# Patient Record
Sex: Male | Born: 1980 | Race: White | Hispanic: No | Marital: Married | Smoking: Current every day smoker
Health system: Southern US, Community
[De-identification: ages and names within clinical notes are randomized; demographics above are authoritative.]

## PROBLEM LIST (undated history)

## (undated) DIAGNOSIS — F329 Major depressive disorder, single episode, unspecified: Secondary | ICD-10-CM

## (undated) DIAGNOSIS — K589 Irritable bowel syndrome without diarrhea: Secondary | ICD-10-CM

## (undated) DIAGNOSIS — F32A Depression, unspecified: Secondary | ICD-10-CM

## (undated) DIAGNOSIS — Z8781 Personal history of (healed) traumatic fracture: Secondary | ICD-10-CM

## (undated) DIAGNOSIS — Z77011 Contact with and (suspected) exposure to lead: Secondary | ICD-10-CM

## (undated) DIAGNOSIS — K219 Gastro-esophageal reflux disease without esophagitis: Secondary | ICD-10-CM

## (undated) DIAGNOSIS — F419 Anxiety disorder, unspecified: Secondary | ICD-10-CM

## (undated) DIAGNOSIS — F17200 Nicotine dependence, unspecified, uncomplicated: Secondary | ICD-10-CM

## (undated) DIAGNOSIS — Z5320 Procedure and treatment not carried out because of patient's decision for unspecified reasons: Secondary | ICD-10-CM

## (undated) DIAGNOSIS — E7800 Pure hypercholesterolemia, unspecified: Secondary | ICD-10-CM

## (undated) HISTORY — DX: Personal history of (healed) traumatic fracture: Z87.81

## (undated) HISTORY — DX: Irritable bowel syndrome, unspecified: K58.9

## (undated) HISTORY — DX: Depression, unspecified: F32.A

## (undated) HISTORY — DX: Pure hypercholesterolemia, unspecified: E78.00

## (undated) HISTORY — DX: Gastro-esophageal reflux disease without esophagitis: K21.9

## (undated) HISTORY — DX: Anxiety disorder, unspecified: F41.9

## (undated) HISTORY — DX: Nicotine dependence, unspecified, uncomplicated: F17.200

## (undated) HISTORY — DX: Contact with and (suspected) exposure to lead: Z77.011

## (undated) HISTORY — DX: Major depressive disorder, single episode, unspecified: F32.9

---
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
## 2018-06-13 HISTORY — PX: AMPUTATION: SHX166

## 2018-06-25 ENCOUNTER — Ambulatory Visit: Payer: BLUE CROSS/BLUE SHIELD | Admitting: Family Medicine

## 2018-06-25 ENCOUNTER — Encounter: Payer: Self-pay | Admitting: Family Medicine

## 2018-06-25 VITALS — BP 120/70 | HR 87 | Temp 98.7°F | Ht 75.0 in | Wt 200.4 lb

## 2018-06-25 DIAGNOSIS — F419 Anxiety disorder, unspecified: Secondary | ICD-10-CM

## 2018-06-25 DIAGNOSIS — Z8781 Personal history of (healed) traumatic fracture: Secondary | ICD-10-CM | POA: Insufficient documentation

## 2018-06-25 DIAGNOSIS — F32A Depression, unspecified: Secondary | ICD-10-CM | POA: Insufficient documentation

## 2018-06-25 DIAGNOSIS — F329 Major depressive disorder, single episode, unspecified: Secondary | ICD-10-CM

## 2018-06-25 DIAGNOSIS — E785 Hyperlipidemia, unspecified: Secondary | ICD-10-CM | POA: Insufficient documentation

## 2018-06-25 DIAGNOSIS — E7800 Pure hypercholesterolemia, unspecified: Secondary | ICD-10-CM | POA: Insufficient documentation

## 2018-06-25 DIAGNOSIS — F17200 Nicotine dependence, unspecified, uncomplicated: Secondary | ICD-10-CM | POA: Insufficient documentation

## 2018-06-25 DIAGNOSIS — Z0000 Encounter for general adult medical examination without abnormal findings: Secondary | ICD-10-CM | POA: Diagnosis not present

## 2018-06-25 DIAGNOSIS — K439 Ventral hernia without obstruction or gangrene: Secondary | ICD-10-CM | POA: Insufficient documentation

## 2018-06-25 DIAGNOSIS — Z77011 Contact with and (suspected) exposure to lead: Secondary | ICD-10-CM | POA: Insufficient documentation

## 2018-06-25 DIAGNOSIS — Z23 Encounter for immunization: Secondary | ICD-10-CM

## 2018-06-25 LAB — POCT URINALYSIS DIP (PROADVANTAGE DEVICE)
Bilirubin, UA: NEGATIVE
Blood, UA: NEGATIVE
Glucose, UA: NEGATIVE mg/dL
Ketones, POC UA: NEGATIVE mg/dL
LEUKOCYTES UA: NEGATIVE
NITRITE UA: NEGATIVE
Protein Ur, POC: NEGATIVE mg/dL
Specific Gravity, Urine: 1.025
UUROB: NEGATIVE
pH, UA: 6 (ref 5.0–8.0)

## 2018-06-25 MED ORDER — ESCITALOPRAM OXALATE 20 MG PO TABS: 20.0000 mg | ORAL_TABLET | Freq: Every day | ORAL | 1 refills | Status: DC

## 2018-06-25 NOTE — Progress Notes (Signed)
Subjective:    Patient ID: Francisco Taylor, male    DOB: 05-05-1981, 38 y.o.   MRN: [id number]  HPI Chief Complaint  Patient presents with  . New Patient (Initial Visit)  . Annual Exam   He is new to the practice and here for a complete physical exam. Previous medical care: [place], [place]  PCP in [place]: one year ago   Other providers: None   Past medical history: Hyperlipidemia- in the past per patient but has never taken medication for this.  States he most recently had normal lipid panel.  States he improved his diet and increased exercise to get this back to normal.  Reports having musculoskeletal pain related to surfing a lot when he was young when he lived in [place].   Mental health history: Anxiety and depression- has been stable on Lexapro for the past 5 years  Started on treatment at age 39.  Has tried different medications over the years. Counseling- not currently but he has in the past.  Reports having a history of a hernia above his umbilicus.  States it occasionally causes some discomfort.  His work sometimes involves lifting heavy items.  Unsure if it is getting larger.  He would like to have this evaluated and potentially be referred to a surgeon.   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  States his employer usually test him for lead exposure.  Reports having a skin issue on his testicles that he would like to have examined.  Reports doing self testicular exams and no lumps or masses.  Social history: Lives with his wife, no kids, works as a Games developer. Smoking 2 ppd and has a 20-year smoking history.  He has tried to stop a couple of times but is not currently planning on stopping. Reports drinking approximately 7 alcoholic drinks per week.  Denies drug use.  Diet: fairly healthy  Exercise: several days per week.   Immunizations: Tdap last year per patient. Flu shot today.  Not aware of having HPV  vaccine  Family history: depression in parents, diabetes- grandfather   Health maintenance:  Colonoscopy: N/A Last PSA: N/A Last Dental Exam: 6-7 years ago. Reports having a fear of dentists.  Last Eye Exam: 2-3 years ago   Wears seatbelt always, uses sunscreen, smoke detectors in home and functioning, does not text while driving, feels safe in home environment.  Reviewed allergies, medications, past medical, surgical, family, and social history.   Review of Systems Review of Systems Constitutional: -fever, -chills, -sweats, -unexpected weight change,-fatigue ENT: -runny nose, -ear pain, -sore throat Cardiology:  -chest pain, -palpitations, -edema Respiratory: -cough, -shortness of breath, -wheezing Gastroenterology: -abdominal pain, -nausea, -vomiting, -diarrhea, -constipation  Hematology: -bleeding or bruising problems Musculoskeletal: -arthralgias, -myalgias, -joint swelling, -back pain Ophthalmology: -vision changes Urology: -dysuria, -difficulty urinating, -hematuria, -urinary frequency, -urgency Neurology: -headache, -weakness, -tingling, -numbness       Objective:   Physical Exam BP 120/70   Pulse 87   Temp 98.7 F (37.1 C) (Oral)   Ht 6\' 3"  (1.905 m)   Wt 200 lb 6.4 oz (90.9 kg)   SpO2 97%   BMI 25.05 kg/m   General Appearance:    Alert, cooperative, no distress, appears stated age  Head:    Normocephalic, without obvious abnormality, atraumatic  Eyes:    PERRL, conjunctiva/corneas clear, EOM's intact, fundi    benign  Ears:    Normal TM's and external ear canals  Nose:  Nares normal, mucosa normal, no drainage or sinus   tenderness  Throat:   Lips, mucosa, and tongue normal; teeth and gums normal  Neck:   Supple, no lymphadenopathy;  thyroid:  no   enlargement/tenderness/nodules; no carotid   bruit or JVD  Back:    Spine nontender, no curvature, ROM normal, no CVA     tenderness  Lungs:     Clear to auscultation bilaterally without wheezes, rales or      ronchi; respirations unlabored  Chest Wall:    No tenderness or deformity   Heart:    Regular rate and rhythm, S1 and S2 normal, no murmur, rub   or gallop  Breast Exam:    No chest wall tenderness, masses or gynecomastia  Abdomen:     Soft, non-tender, nondistended, normoactive bowel sounds,    no masses, no hepatosplenomegaly  Genitalia:    Normal male external genitalia with 2 small superficial benign appearing cysts, nontender. testicles without masses.  No inguinal hernias.  Rectal:   Deferred due to age <40 and lack of symptoms  Extremities:   No clubbing, cyanosis or edema  Pulses:   2+ and symmetric all extremities  Skin:   Skin color, texture, turgor normal, no rashes or lesions  Lymph nodes:   Cervical, supraclavicular, and axillary nodes normal  Neurologic:   CNII-XII intact, normal strength, sensation and gait; reflexes 2+ and symmetric throughout          Psych:   Normal mood, affect, hygiene and grooming.    Urinalysis dipstick: negative       Assessment & Plan:  Routine general medical examination at a health care facility - Plan: POCT Urinalysis DIP (Proadvantage Device)  Needs flu shot - Plan: Flu Vaccine QUAD 36+ mos IM  Anxiety and depression - Plan: escitalopram (LEXAPRO) 20 MG tablet  Smoker  Abdominal wall hernia - Plan: Ambulatory referral to General Surgery  Is a pleasant 38 year old male who is new to the practice and here to establish care as well as for a CPE.  He is not fasting and does not want blood work checked today. Reports being stable on his current dose of Lexapro for several years.  Requests a refill.  He is not involved in counseling but is willing to consider scheduling appointment.  I will give him a list of offices that he can call to schedule with if he chooses.  Smoking cessation counseling was done.  He has a heavy smoking history.  He is not ready to stop. He does have a small abdominal wall hernia and he would like a referral to general  surgery.  This was done per patient request.  Counseling done on the possible complication of incarceration or strangulation and when to seek immediate medical care. Dorothea Ogle PA also examined patient's testicles at my request and he appears to have 2 small benign appearing superficial skin cysts that do not require further investigation.  The patient will continue doing self testicular exams. Immunization counseling done.  Flu shot was given.  He reports being up-to-date on Tdap, no records available.  Discussed HPV vaccine and he may check with his insurance company regarding this immunization. Recommend regular dental cleanings.  He has a fear of dental appointments.  Discussed the possibility of prescribing antianxiety medication specifically for this if he decides to schedule an appointment. 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  He declines blood work today and states his employer  does not periodic lead screening. Follow-up in 6 months or sooner if needed.

## 2018-06-25 NOTE — Patient Instructions (Addendum)
It was a pleasure meeting you today.  Continue eating a well-balanced, healthy diet and getting at least 150 minutes of physical activity per week.  You received your flu shot today.  You may check with your insurance regarding the HPV vaccine. This is a 3 shot series over 6 months.  Call and schedule a nurse visit if you decide to get these.   I recommend that you strongly consider stopping smoking.  You can check with your insurance carrier regarding dentists who are in your network.  You can call to schedule your appointment with a counselor. A couple offices are listed below for you to call.   [facility]  [address], [place], [place] [address] Phone: [phone]   [place]  [place]  (across from [facility])  [facility] [address] [place], [place] [address] [address], [place], [place] [address]  Phone: [phone]   [place], [place] [address]  Phone: [phone]    Preventive Care 18-39 Years, Male Preventive care refers to lifestyle choices and visits with your health care provider that can promote health and wellness. What does preventive care include?   A yearly physical exam. This is also called an annual well check.  Dental exams once or twice a year.  Routine eye exams. Ask your health care provider how often you should have your eyes checked.  Personal lifestyle choices, including: ? Daily care of your teeth and gums. ? Regular physical activity. ? Eating a healthy diet. ? Avoiding tobacco and drug use. ? Limiting alcohol use. ? Practicing safe sex. What happens during an annual well check? The services and screenings done by your health care provider during your annual well check will  depend on your age, overall health, lifestyle risk factors, and family history of disease. Counseling Your health care provider may ask you questions about your:  Alcohol use.  Tobacco use.  Drug use.  Emotional well-being.  Home and relationship well-being.  Sexual activity.  Eating habits.  Work and work Statistician. Screening You may have the following tests or measurements:  Height, weight, and BMI.  Blood pressure.  Lipid and cholesterol levels. These may be checked every 5 years starting at age 7.  Diabetes screening. This is done by checking your blood sugar (glucose) after you have not eaten for a while (fasting).  Skin check.  Hepatitis C blood test.  Hepatitis B blood test.  Sexually transmitted disease (STD) testing. Discuss your test results, treatment options, and if necessary, the need for more tests with your health care provider. Vaccines Your health care provider may recommend certain vaccines, such as:  Influenza vaccine. This is recommended every year.  Tetanus, diphtheria, and acellular pertussis (Tdap, Td) vaccine. You may need a Td booster every 10 years.  Varicella vaccine. You may need this if you have not been vaccinated.  HPV vaccine. If you are 5 or younger, you may need three doses over 6 months.  Measles, mumps, and rubella (MMR) vaccine. You may need at least one dose of MMR.You may also need a second dose.  Pneumococcal 13-valent conjugate (PCV13) vaccine. You may need this if you have certain conditions and have not been vaccinated.  Pneumococcal polysaccharide (PPSV23) vaccine. You may need  one or two doses if you smoke cigarettes or if you have certain conditions.  Meningococcal vaccine. One dose is recommended if you are age 24-21 years and a first-year college student living in a residence hall, or if you have one of several medical conditions. You may also need additional booster doses.  Hepatitis A vaccine. You may need  this if you have certain conditions or if you travel or work in places where you may be exposed to hepatitis A.  Hepatitis B vaccine. You may need this if you have certain conditions or if you travel or work in places where you may be exposed to hepatitis B.  Haemophilus influenzae type b (Hib) vaccine. You may need this if you have certain risk factors. Talk to your health care provider about which screenings and vaccines you need and how often you need them. This information is not intended to replace advice given to you by your health care provider. Make sure you discuss any questions you have with your health care provider. Document Released: 07/26/2001 Document Revised: 01/10/2017 Document Reviewed: 03/31/2015 Elsevier Interactive Patient Education  2019 [employer].

## 2018-08-15 ENCOUNTER — Ambulatory Visit (HOSPITAL_COMMUNITY)
Admission: EM | Admit: 2018-08-15 | Discharge: 2018-08-15 | Disposition: A | Discharge status: Home / Self Care | Payer: PRIVATE HEALTH INSURANCE | Attending: Family Medicine | Admitting: Family Medicine

## 2018-08-15 ENCOUNTER — Encounter (HOSPITAL_COMMUNITY): Payer: Self-pay | Admitting: Emergency Medicine

## 2018-08-15 DIAGNOSIS — S61412A Laceration without foreign body of left hand, initial encounter: Secondary | ICD-10-CM

## 2018-08-15 NOTE — ED Triage Notes (Signed)
Pt states he stuck a chisel in his L hand. Wound evaluated by another clinical staff. Currently wrapped up. Up to date on tetanus.

## 2018-08-27 NOTE — ED Provider Notes (Signed)
  Francisco Taylor   [id number] 08/15/18 Arrival Time: 1916  ASSESSMENT & PLAN:  1. Laceration of left hand without foreign body, initial encounter    Procedure: Verbal consent obtained. Patient provided with risks and alternatives to the procedure. Wound copiously irrigated with NS then cleansed with betadine. Anesthetized with 3 mL of lidocaine without epinephrine. Wound carefully explored. No foreign body, tendon injury, or nonviable tissue were noted. Using sterile technique, wound closed with interrupted 5-0 Prolene sutures were placed to reapproximate the wound. Patient tolerated procedure well. No complications. Minimal bleeding. Patient advised to look for and return for any signs of infection such as redness, swelling, discharge, or worsening pain. Return for suture removal in 7 days.  Reviewed expectations re: course of current medical issues. Questions answered. Outlined signs and symptoms indicating need for more acute intervention. Patient verbalized understanding. After Visit Summary given.   SUBJECTIVE:  Francisco Taylor is a 38 y.o. male who presents with a laceration of his left hand. Cut with a chisel at work. Moderate bleeding; controlled with pressure. No extremity sensation changes or weakness. Describes FROM of all fingers. No extremity sensation changes or weakness.  Td UTD: Yes.  ROS: As per HPI.   OBJECTIVE:  Vitals:   08/15/18 2014  BP: 122/79  Pulse: 100  Resp: 16  Temp: 98.5 F (36.9 C)  SpO2: 100%     General appearance: alert; no distress Skin: curved laceration L hand; palmar; near proximal thumb; size: approx 1.5 cm; clean wound edges, no foreign bodies; without active bleeding Psychological: alert and cooperative; normal mood and affect    Allergies  Allergen Reactions  . Bee Venom Swelling    Past Medical History:  Diagnosis Date  . Anxiety and depression   . History of cervical fracture   . Hx of lead exposure    Social  History   Socioeconomic History  . Marital status: Married    Spouse name: Not on file  . Number of children: Not on file  . Years of education: Not on file  . Highest education level: Not on file  Occupational History  . Not on file  Social Needs  . Financial resource strain: Not on file  . Food insecurity:    Worry: Not on file    Inability: Not on file  . Transportation needs:    Medical: Not on file    Non-medical: Not on file  Tobacco Use  . Smoking status: Current Every Day Smoker    Years: 20.00  . Smokeless tobacco: Never Used  Substance and Sexual Activity  . Alcohol use: Yes    Comment: 7-10 drinks per week   . Drug use: Never  . Sexual activity: Yes  Lifestyle  . Physical activity:    Days per week: Not on file    Minutes per session: Not on file  . Stress: Not on file  Relationships  . Social connections:    Talks on phone: Not on file    Gets together: Not on file    Attends religious service: Not on file    Active member of club or organization: Not on file    Attends meetings of clubs or organizations: Not on file    Relationship status: Not on file  Other Topics Concern  . Not on file  Social History Narrative  . Not on file         Vanessa Kick, MD 08/30/18 [phone]

## 2018-09-26 ENCOUNTER — Emergency Department (HOSPITAL_COMMUNITY)
Admission: EM | Admit: 2018-09-26 | Discharge: 2018-09-26 | Disposition: A | Discharge status: Home / Self Care | Payer: PRIVATE HEALTH INSURANCE | Attending: Emergency Medicine | Admitting: Emergency Medicine

## 2018-09-26 ENCOUNTER — Encounter (HOSPITAL_COMMUNITY): Payer: Self-pay

## 2018-09-26 ENCOUNTER — Other Ambulatory Visit: Payer: Self-pay

## 2018-09-26 ENCOUNTER — Emergency Department (HOSPITAL_COMMUNITY): Payer: PRIVATE HEALTH INSURANCE

## 2018-09-26 DIAGNOSIS — Y9289 Other specified places as the place of occurrence of the external cause: Secondary | ICD-10-CM | POA: Diagnosis not present

## 2018-09-26 DIAGNOSIS — S61327A Laceration with foreign body of left little finger with damage to nail, initial encounter: Secondary | ICD-10-CM | POA: Diagnosis present

## 2018-09-26 DIAGNOSIS — Y990 Civilian activity done for income or pay: Secondary | ICD-10-CM | POA: Diagnosis not present

## 2018-09-26 DIAGNOSIS — S62667B Nondisplaced fracture of distal phalanx of left little finger, initial encounter for open fracture: Secondary | ICD-10-CM

## 2018-09-26 DIAGNOSIS — Y9389 Activity, other specified: Secondary | ICD-10-CM | POA: Diagnosis not present

## 2018-09-26 DIAGNOSIS — W312XXA Contact with powered woodworking and forming machines, initial encounter: Secondary | ICD-10-CM | POA: Diagnosis not present

## 2018-09-26 DIAGNOSIS — F17210 Nicotine dependence, cigarettes, uncomplicated: Secondary | ICD-10-CM | POA: Diagnosis not present

## 2018-09-26 MED ORDER — CEPHALEXIN 500 MG PO CAPS: 500.0000 mg | ORAL_CAPSULE | Freq: Four times a day (QID) | ORAL | 0 refills | Status: DC

## 2018-09-26 MED ORDER — BUPIVACAINE HCL (PF) 0.5 % IJ SOLN
10.0000 mL | Freq: Once | INTRAMUSCULAR | Status: AC
  Administered 2018-09-26: 10 mL
  Filled 2018-09-26: qty 10

## 2018-09-26 MED ORDER — HYDROCODONE-ACETAMINOPHEN 5-325 MG PO TABS: 1.0000 | ORAL_TABLET | ORAL | 0 refills | Status: DC | PRN

## 2018-09-26 MED ORDER — CEFAZOLIN SODIUM-DEXTROSE 1-4 GM/50ML-% IV SOLN
1.0000 g | Freq: Once | INTRAVENOUS | Status: AC
  Administered 2018-09-26: 14:00:00 1 g via INTRAVENOUS
  Filled 2018-09-26: qty 50

## 2018-09-26 NOTE — ED Notes (Signed)
Patient verbalizes understanding of discharge instructions. Opportunity for questioning and answers were provided. Armband removed by staff, pt discharged from ED. Dressing and finger splint applied. Prescriptions, pharmacy and follow up care reviewed. Pt ambulatory to lobby.

## 2018-09-26 NOTE — ED Notes (Signed)
Ambrose Finland, PA at bedside

## 2018-09-26 NOTE — ED Notes (Signed)
Pt returned from XR. EDP at bedside

## 2018-09-26 NOTE — Discharge Instructions (Addendum)
Keep dressing intact and dry.

## 2018-09-26 NOTE — ED Provider Notes (Signed)
[facility] EMERGENCY DEPARTMENT Provider Note   CSN: [id number] Arrival date & time: 09/26/18  1130    History   Chief Complaint Chief Complaint  Patient presents with  . Finger Injury    HPI Francisco Taylor is a 38 y.o. male.     Pt presents to the ED today with a laceration to his left pinky finger.  He is a Games developer.  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  The pt said his tetanus is UTD.  The pt denies any other injury.     Past Medical History:  Diagnosis Date  . Anxiety and depression   . History of cervical fracture   . Hx of lead exposure     Patient Active Problem List   Diagnosis Date Noted  . Smoker 06/25/2018  . Abdominal wall hernia 06/25/2018  . Anxiety and depression   . Hyperlipidemia   . History of cervical fracture   . Hx of lead exposure     History reviewed. No pertinent surgical history.      Home Medications    Prior to Admission medications   Medication Sig Start Date End Date Taking? Authorizing Provider  cephALEXin (KEFLEX) 500 MG capsule Take 1 capsule (500 mg total) by mouth 4 (four) times daily. 09/26/18   Isla Pence, MD  escitalopram (LEXAPRO) 20 MG tablet Take 1 tablet (20 mg total) by mouth daily. 06/25/18   Henson, Vickie L, NP-C  HYDROcodone-acetaminophen (NORCO/VICODIN) 5-325 MG tablet Take 1 tablet by mouth every 4 (four) hours as needed. 09/26/18   Isla Pence, MD    Family History Family History  Problem Relation Age of Onset  . Depression Mother   . Depression Father   . Diabetes Paternal Grandfather     Social History Social History   Tobacco Use  . Smoking status: Current Every Day Smoker    Years: 20.00  . Smokeless tobacco: Never Used  Substance Use Topics  . Alcohol use: Yes    Comment: 7-10 drinks per week   . Drug use: Never     Allergies   Bee venom   Review of Systems Review of Systems  Musculoskeletal:       Left 5th finger injury  All other systems reviewed and  are negative.    Physical Exam Updated Vital Signs BP 125/76   Pulse 80   Temp 98.4 F (36.9 C) (Oral)   SpO2 100%   Physical Exam Vitals signs and nursing note reviewed.  Constitutional:      Appearance: Normal appearance.  HENT:     Head: Normocephalic and atraumatic.     Right Ear: External ear normal.     Left Ear: External ear normal.     Nose: Nose normal.     Mouth/Throat:     Mouth: Mucous membranes are moist.  Eyes:     Extraocular Movements: Extraocular movements intact.     Pupils: Pupils are equal, round, and reactive to light.  Neck:     Musculoskeletal: Normal range of motion and neck supple.  Cardiovascular:     Rate and Rhythm: Normal rate and regular rhythm.     Pulses: Normal pulses.     Heart sounds: Normal heart sounds.  Pulmonary:     Effort: Pulmonary effort is normal.     Breath sounds: Normal breath sounds.  Abdominal:     General: Abdomen is flat.     Palpations: Abdomen is soft.  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  See picture.  Skin:    General: Skin is warm.     Capillary Refill: Capillary refill takes less than 2 seconds.  Neurological:     General: No focal deficit present.     Mental Status: He is alert and oriented to person, place, and time.  Psychiatric:        Mood and Affect: Mood normal.        Behavior: Behavior normal.          ED Treatments / Results  Labs (all labs ordered are listed, but only abnormal results are displayed) Labs Reviewed - No data to display  EKG None  Radiology Dg Finger Little Left  Result Date: 09/26/2018 CLINICAL DATA:  Injured fifth finger with saw today. EXAM: LEFT LITTLE FINGER 2+V COMPARISON:  None. FINDINGS: Examination demonstrates amputation of the distal aspect of the fifth distal phalanx with associated soft tissue laceration. Remainder of the exam is unremarkable. 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 Electronically Signed   By: Marin Olp M.D.   On: 09/26/2018 12:24    Procedures .Nerve Block Date/Time: 09/26/2018 1:28 PM Performed by: Isla Pence, MD Authorized by: Isla Pence, MD   Consent:    Consent obtained:  Verbal   Consent given by:  Patient   Risks discussed:  Pain and unsuccessful block   Alternatives discussed:  No treatment Indications:    Indications:  Pain relief Location:    Body area:  Upper extremity   Laterality:  Left Pre-procedure details:    Skin preparation:  Povidone-iodine Procedure details (see MAR for exact dosages):    Block needle gauge:  30 G   Anesthetic injected:  Bupivacaine 0.5% w/o epi   Steroid injected:  None   Additive injected:  None   Injection procedure:  Anatomic landmarks identified, incremental injection, negative aspiration for blood, introduced needle and anatomic landmarks palpated   Paresthesia:  None Post-procedure details:    Dressing:  Sterile dressing   Outcome:  Anesthesia achieved   Patient tolerance of procedure:  Tolerated well, no immediate complications Comments:     Digital block left 5th finger.   (including critical care time)  Medications Ordered in ED Medications  ceFAZolin (ANCEF) IVPB 1 g/50 mL premix (has no administration in time range)  bupivacaine (MARCAINE) 0.5 % injection 10 mL (10 mLs Infiltration Given by Other 09/26/18 1154)     Initial Impression / Assessment and Plan / ED Course  I have reviewed the triage vital signs and the nursing notes.  Pertinent labs & imaging results that were available during my care of the patient were reviewed by me and considered in my medical decision making (see chart for details).    Pt d/w Silvestre Gunner (hand) who came to see pt.  He said Dr. Jeannie Fend ([place]) will take care of it as an outpatient.  Pt will be given a dose of IV Ancef prior to d/c and sent home on keflex.  Wound will be wrapped and splinted.     Final Clinical Impressions(s) / ED Diagnoses   Final diagnoses:  Open nondisplaced fracture of distal phalanx of left little finger, initial encounter    ED Discharge Orders         Ordered    cephALEXin (KEFLEX) 500 MG capsule  4 times daily     09/26/18 1328    HYDROcodone-acetaminophen (NORCO/VICODIN) 5-325 MG  tablet  Every 4 hours PRN     09/26/18 1328           Isla Pence, MD 09/26/18 1329

## 2018-09-26 NOTE — ED Triage Notes (Addendum)
Pt endorses running left pinky finger through a saw about half an hour ago. Pt endorses pain currently 6/10. Pt alert, oriented, and ambulatory. Last tetanus shot about 2 months ago.

## 2018-09-26 NOTE — Consult Note (Signed)
Reason for Consult:Left little finger injury Referring Physician: Aileen Pilot  Francisco Taylor is an 38 y.o. male.  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 He came to the ED for evaluation and hand surgery was consulted. He is RHD.  Past Medical History:  Diagnosis Date  . Anxiety and depression   . History of cervical fracture   . Hx of lead exposure     History reviewed. No pertinent surgical history.  Family History  Problem Relation Age of Onset  . Depression Mother   . Depression Father   . Diabetes Paternal Grandfather     Social History:  reports that he has been smoking. He has smoked for the past 20.00 years. He has never used smokeless tobacco. He reports current alcohol use. He reports that he does not use drugs.  Allergies:  Allergies  Allergen Reactions  . Bee Venom Swelling    Medications: I have reviewed the patient's current medications.  No results found for this or any previous visit (from the past 48 hour(s)).  Dg Finger Little Left  Result Date: 09/26/2018 CLINICAL DATA:  Injured fifth finger with saw today. EXAM: LEFT LITTLE FINGER 2+V COMPARISON:  None. FINDINGS: Examination demonstrates amputation of the distal aspect of the fifth distal phalanx with associated soft tissue laceration. Remainder of the exam is unremarkable. IMPRESSION: Amputation of the distal aspect of the fifth distal phalanx with associated soft tissue injury. Electronically Signed   By: Marin Olp M.D.   On: 09/26/2018 12:24    Review of Systems  Constitutional: Negative for weight loss.  HENT: Negative for ear discharge, ear pain, hearing loss and tinnitus.   Eyes: Negative for blurred vision, double vision, photophobia and pain.  Respiratory: Negative for cough, sputum production and shortness of breath.   Cardiovascular: Negative for chest pain.  Gastrointestinal: Negative for abdominal pain, nausea and vomiting.   Genitourinary: Negative for dysuria, flank pain, frequency and urgency.  Musculoskeletal: Positive for joint pain (Left little finger). Negative for back pain, falls, myalgias and neck pain.  Neurological: Negative for dizziness, tingling, sensory change, focal weakness, loss of consciousness and headaches.  Endo/Heme/Allergies: Does not bruise/bleed easily.  Psychiatric/Behavioral: Negative for depression, memory loss and substance abuse. The patient is not nervous/anxious.    Blood pressure 125/76, pulse 80, temperature 98.4 F (36.9 C), temperature source Oral, SpO2 100 %. Physical Exam  Constitutional: He appears well-developed and well-nourished. No distress.  HENT:  Head: Normocephalic and atraumatic.  Eyes: Conjunctivae are normal. Right eye exhibits no discharge. Left eye exhibits no discharge. No scleral icterus.  Neck: Normal range of motion.  Cardiovascular: Normal rate and regular rhythm.  Respiratory: Effort normal. No respiratory distress.  Musculoskeletal:     Comments: Left shoulder, elbow, wrist, digits- Partial amputation P3 little finger, most of nail bed missing, no instability, no blocks to motion  Sens  Ax/R/M/U intact  Mot   Ax/ R/ PIN/ M/ AIN/ U intact  Rad 2+  Neurological: He is alert.  Skin: Skin is warm and dry. He is not diaphoretic.  Psychiatric: He has a normal mood and affect. His behavior is normal.    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 Jeannie Fend on Friday.    Lisette Abu, PA-C Orthopedic Surgery [phone] 09/26/2018, 1:19 PM

## 2018-09-28 DIAGNOSIS — M79645 Pain in left finger(s): Secondary | ICD-10-CM | POA: Insufficient documentation

## 2018-09-29 DIAGNOSIS — S68119A Complete traumatic metacarpophalangeal amputation of unspecified finger, initial encounter: Secondary | ICD-10-CM | POA: Insufficient documentation

## 2018-12-25 ENCOUNTER — Ambulatory Visit: Payer: BLUE CROSS/BLUE SHIELD | Admitting: Family Medicine

## 2018-12-28 ENCOUNTER — Ambulatory Visit: Payer: BLUE CROSS/BLUE SHIELD | Admitting: Family Medicine

## 2019-01-21 ENCOUNTER — Other Ambulatory Visit: Payer: Self-pay

## 2019-01-21 ENCOUNTER — Ambulatory Visit: Payer: BLUE CROSS/BLUE SHIELD | Admitting: Family Medicine

## 2019-01-21 ENCOUNTER — Encounter: Payer: Self-pay | Admitting: Family Medicine

## 2019-01-21 VITALS — BP 120/78 | HR 78 | Temp 98.8°F | Ht 75.0 in | Wt 201.4 lb

## 2019-01-21 DIAGNOSIS — D229 Melanocytic nevi, unspecified: Secondary | ICD-10-CM

## 2019-01-21 DIAGNOSIS — F419 Anxiety disorder, unspecified: Secondary | ICD-10-CM | POA: Diagnosis not present

## 2019-01-21 DIAGNOSIS — F17200 Nicotine dependence, unspecified, uncomplicated: Secondary | ICD-10-CM

## 2019-01-21 DIAGNOSIS — R0982 Postnasal drip: Secondary | ICD-10-CM

## 2019-01-21 DIAGNOSIS — E785 Hyperlipidemia, unspecified: Secondary | ICD-10-CM

## 2019-01-21 DIAGNOSIS — F329 Major depressive disorder, single episode, unspecified: Secondary | ICD-10-CM

## 2019-01-21 DIAGNOSIS — F32A Depression, unspecified: Secondary | ICD-10-CM

## 2019-01-21 DIAGNOSIS — Z77011 Contact with and (suspected) exposure to lead: Secondary | ICD-10-CM | POA: Diagnosis not present

## 2019-01-21 NOTE — Patient Instructions (Addendum)
    Dermatology offices  [facility]: Phone #: [phone] Address: [address] [place], [place] [address]  [facility]: Phone: [phone]  Address: [address] [place], [place] [address]  [facility] Address: [address] [place], [place], [place] [address] Phone: [phone]   Try taking an over the counter Xyzal, Allegra, or Claritin for your allergies.   I recommend cutting back on alcohol.   We will contact you with your lab results.

## 2019-01-21 NOTE — Progress Notes (Signed)
   Subjective:    Patient ID: Francisco Taylor, male    DOB: [birth year], 38 y.o.   MRN: [id number]  HPI Chief Complaint  Patient presents with  . Medication Management    follow up on Lexapro-feels good on medication    Here for a 6 month follow up on anxiety and depression and has other concerns today.   States he is doing well on current dose of Lexapro. No side effects. Thought he may want to try and come off medication but due to new stress of Covid-19 pandemic, he would like to continue it.   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 Would like blood test to screen for lead. States he has not had one through his employer in 2-3 years.   Would like to have a mole on his abdomen rechecked. He thinks it is changing, getting darker and larger. Does not have a dermatologist.   Smoker and not ready to stop.   Drinks alcohol most days.   Reports having post nasal drainage and thinks the mask is making allergies worse. Using Flonase.   Denies fever, chills, dizziness, chest pain, palpitations, shortness of breath, abdominal pain, N/V/D, urinary symptoms, LE edema.    Has upcoming referral for EDG and colonoscopy at [facility].    Reviewed allergies, medications, past medical, surgical, family, and social history.   Review of Systems Pertinent positives and negatives in the history of present illness.     Objective:   Physical Exam BP 120/78   Pulse 78   Temp 98.8 F (37.1 C) (Oral)   Ht 6\' 3"  (1.905 m)   Wt 201 lb 6.4 oz (91.4 kg)   SpO2 97%   BMI 25.17 kg/m   Alert and in no distress. Cardiac exam shows a regular rhythm without murmurs or gallops. Lungs are clear to auscultation. Extremities without edema. Skin is warm and dry. He has a slightly irregular oval nevus to his right upper abdomen, > 19mm, pigmentation is somewhat irregular as well.         Assessment & Plan:  Anxiety and depression - Plan: CBC  with Differential/Platelet, Comprehensive metabolic panel, T4, free, TSH, he is doing well on current medication and I recommend he continue.   Hyperlipidemia, unspecified hyperlipidemia type - Plan: CBC with Differential/Platelet, Comprehensive metabolic panel, Lipid panel, check labs and follow up.   Hx of lead exposure - Plan: Lead, blood (adult age 39 yrs or greater), CBC with Differential/Platelet, Comprehensive metabolic panel, check labs and follow up.   Smoker - Plan: recommend he stop to improve health.   Atypical nevi - Plan: see dermatology. He will call to schedule.   Post-nasal drainage - Plan: CBC with Differential/Platelet, Comprehensive metabolic panel, most likely allergy related. Continue with Flonase and try adding Claritin, Allegra or Xyzal.

## 2019-01-23 LAB — COMPREHENSIVE METABOLIC PANEL
ALT: 13 IU/L (ref 0–44)
AST: 21 IU/L (ref 0–40)
Albumin/Globulin Ratio: 2.6 — ABNORMAL HIGH (ref 1.2–2.2)
Albumin: 5 g/dL (ref 4.0–5.0)
Alkaline Phosphatase: 67 IU/L (ref 39–117)
BUN/Creatinine Ratio: 9 (ref 9–20)
BUN: 9 mg/dL (ref 6–20)
Bilirubin Total: 0.4 mg/dL (ref 0.0–1.2)
CO2: 27 mmol/L (ref 20–29)
Calcium: 9.8 mg/dL (ref 8.7–10.2)
Chloride: 100 mmol/L (ref 96–106)
Creatinine, Ser: 0.99 mg/dL (ref 0.76–1.27)
GFR calc Af Amer: 113 mL/min/{1.73_m2} (ref >59)
GFR calc non Af Amer: 98 mL/min/{1.73_m2} (ref >59)
Globulin, Total: 1.9 g/dL (ref 1.5–4.5)
Glucose: 105 mg/dL — ABNORMAL HIGH (ref 65–99)
Potassium: 5.3 mmol/L — ABNORMAL HIGH (ref 3.5–5.2)
Sodium: 141 mmol/L (ref 134–144)
Total Protein: 6.9 g/dL (ref 6.0–8.5)

## 2019-01-23 LAB — CBC WITH DIFFERENTIAL/PLATELET
Basophils Absolute: 0.1 10*3/uL (ref 0.0–0.2)
Basos: 1 %
EOS (ABSOLUTE): 0.1 10*3/uL (ref 0.0–0.4)
Eos: 1 %
Hematocrit: 48.6 % (ref 37.5–51.0)
Hemoglobin: 17.1 g/dL (ref 13.0–17.7)
Immature Grans (Abs): 0 10*3/uL (ref 0.0–0.1)
Immature Granulocytes: 0 %
Lymphocytes Absolute: 1.8 10*3/uL (ref 0.7–3.1)
Lymphs: 25 %
MCH: 31.7 pg (ref 26.6–33.0)
MCHC: 35.2 g/dL (ref 31.5–35.7)
MCV: 90 fL (ref 79–97)
Monocytes Absolute: 0.4 10*3/uL (ref 0.1–0.9)
Monocytes: 6 %
Neutrophils Absolute: 4.7 10*3/uL (ref 1.4–7.0)
Neutrophils: 67 %
Platelets: 148 10*3/uL — ABNORMAL LOW (ref 150–450)
RBC: 5.39 x10E6/uL (ref 4.14–5.80)
RDW: 12.9 % (ref 11.6–15.4)
WBC: 7.1 10*3/uL (ref 3.4–10.8)

## 2019-01-23 LAB — LIPID PANEL
Chol/HDL Ratio: 5.5 ratio — ABNORMAL HIGH (ref 0.0–5.0)
Cholesterol, Total: 285 mg/dL — ABNORMAL HIGH (ref 100–199)
HDL: 52 mg/dL (ref >39)
LDL Calculated: 200 mg/dL — ABNORMAL HIGH (ref 0–99)
Triglycerides: 167 mg/dL — ABNORMAL HIGH (ref 0–149)
VLDL Cholesterol Cal: 33 mg/dL (ref 5–40)

## 2019-01-23 LAB — TSH: TSH: 1.2 u[IU]/mL (ref 0.450–4.500)

## 2019-01-23 LAB — T4, FREE: Free T4: 1.17 ng/dL (ref 0.82–1.77)

## 2019-01-23 LAB — LEAD, BLOOD (ADULT >= 16 YRS): Lead-Whole Blood: 8 ug/dL — ABNORMAL HIGH (ref 0–4)

## 2019-01-25 ENCOUNTER — Encounter: Payer: Self-pay | Admitting: Family Medicine

## 2019-01-25 DIAGNOSIS — Z5320 Procedure and treatment not carried out because of patient's decision for unspecified reasons: Secondary | ICD-10-CM

## 2019-01-25 HISTORY — DX: Procedure and treatment not carried out because of patient's decision for unspecified reasons: Z53.20

## 2019-02-07 ENCOUNTER — Telehealth: Payer: Self-pay | Admitting: Family Medicine

## 2019-02-07 ENCOUNTER — Other Ambulatory Visit: Payer: Self-pay | Admitting: Family Medicine

## 2019-02-07 DIAGNOSIS — E785 Hyperlipidemia, unspecified: Secondary | ICD-10-CM

## 2019-02-07 DIAGNOSIS — Z79899 Other long term (current) drug therapy: Secondary | ICD-10-CM

## 2019-02-07 MED ORDER — ATORVASTATIN CALCIUM 20 MG PO TABS: 20.0000 mg | ORAL_TABLET | Freq: Every day | ORAL | 1 refills | Status: DC

## 2019-02-07 NOTE — Telephone Encounter (Signed)
Pt and said he changed his mind and wanted to know if he could now get the medicine for cholesterol sent to the [facility] on [address]

## 2019-02-07 NOTE — Telephone Encounter (Signed)
I sent in the cholesterol medication. He will need to return for a fasting lab visit 6 weeks after starting the statin. The orders are in for this.

## 2019-02-07 NOTE — Telephone Encounter (Signed)
LVM for pt to call back and make nurse visit for repeat labs . [place]

## 2019-02-11 NOTE — Telephone Encounter (Signed)
Pt was advised and will call back to make appt for follow up labs . Pt was to fast. [facility]

## 2019-02-12 HISTORY — PX: COLONOSCOPY: SHX174

## 2019-02-12 HISTORY — PX: ESOPHAGOGASTRODUODENOSCOPY: SHX1529

## 2019-02-25 ENCOUNTER — Telehealth: Payer: Self-pay | Admitting: Family Medicine

## 2019-02-25 NOTE — Telephone Encounter (Signed)
Pt called and states that he is having issues with atorvastatin. He is experiencing nausea and diarrhea. He want to stop medication. Please advise pt at [phone].

## 2019-02-25 NOTE — Telephone Encounter (Signed)
Pt was notified and has an appt next week

## 2019-02-25 NOTE — Telephone Encounter (Signed)
Ok to stop medication and then see if his symptoms resolve. We can do virtual visit to discuss options.

## 2019-03-07 ENCOUNTER — Ambulatory Visit: Payer: BLUE CROSS/BLUE SHIELD | Admitting: Family Medicine

## 2019-03-07 ENCOUNTER — Encounter: Payer: Self-pay | Admitting: Family Medicine

## 2019-03-07 ENCOUNTER — Other Ambulatory Visit: Payer: Self-pay

## 2019-03-07 VITALS — Temp 98.2°F | Wt 195.0 lb

## 2019-03-07 DIAGNOSIS — E785 Hyperlipidemia, unspecified: Secondary | ICD-10-CM | POA: Diagnosis not present

## 2019-03-07 MED ORDER — LIVALO 2 MG PO TABS: 1.0000 | ORAL_TABLET | Freq: Every day | ORAL | 2 refills | Status: DC

## 2019-03-07 NOTE — Progress Notes (Signed)
   Subjective:  Documentation for virtual audio and video telecommunications through [place] encounter:  The patient was located at work. 2 patient identifiers used.  The provider was located in the office. The patient did consent to this visit and is aware of possible charges through their insurance for this visit.  The other persons participating in this telemedicine service were none.    Patient ID: Francisco Taylor, male    DOB: 01-31-1981, 38 y.o.   MRN: [id number]  HPI Chief Complaint  Patient presents with  . follow-up    follow-up Cholesterol med. atorvastatin was making him sick.-   This is a follow up on elevated LDL and possible side effects from atorvastatin 20 mg. States he had nausea and diarrhea while taking the statin so after 3 weeks he stopped it and symptoms resolved.   States he was on a statin 1 1/2 years ago and cannot recall which one it was. States he had muscle cramps with that one.  He has not tried Co-Q-10.   He asks about whether he should take niacin or not.   LDL 200   Discussed the other abnormal labs including marginally elevated potassium, mildly low platelets and that I would like to recheck these labs as well.   States he switched jobs and no longer exposed to lead as he was at his previous job. His iron level was mildly elevated indicating exposure.   States he is seeing Dr. Margarette Canada at [facility]. States he is having an EGD and colonoscopy next week.   Denies fever, chills, dizziness, chest pain, palpitations, shortness of breath, abdominal pain, N/V/D.   Reviewed allergies, medications, past medical, surgical, family, and social history.   Review of Systems Pertinent positives and negatives in the history of present illness.     Objective:   Physical Exam Temp 98.2 F (36.8 C)   Wt 195 lb (88.5 kg)   BMI 24.37 kg/m   Alert and oriented and in no acute distress. Not otherwise examined due to this being a virtual visit.       Assessment  & Plan:  Hyperlipidemia, unspecified hyperlipidemia type - Plan: Pitavastatin Calcium (LIVALO) 2 MG TABS  He reportedly had side effects to atorvastatin and one other statin in the past 2 years. Therefore, he has failed 2 statins and this will be a last attempt at the statin class to help lower his LDL.  He will take Co-Q10 OTC if he chooses. Encouraged him to start on a multi-vitamin.  Follow up in 6 weeks or sooner if needed. Will recheck lipids and CMP at that time. Will also need to recheck platelets.   Time spent on call was 14 minutes and in review of previous records 2 minutes total.  This virtual service is not related to other E/M service within previous 7 days.

## 2019-03-11 LAB — HM COLONOSCOPY

## 2019-03-12 ENCOUNTER — Telehealth: Payer: Self-pay | Admitting: Family Medicine

## 2019-03-12 NOTE — Telephone Encounter (Signed)
Called pt and he couldn't remember name of Statin,  Called pharmacy & they do not have history of another Statin, it was before he moved to [place]. P.A. Livalo

## 2019-03-16 NOTE — Telephone Encounter (Signed)
P.A. approved, printed discount card,  called pharmacy went thru for $18. Left message for pt

## 2019-04-11 ENCOUNTER — Encounter: Payer: Self-pay | Admitting: Internal Medicine

## 2019-05-24 ENCOUNTER — Other Ambulatory Visit: Payer: Self-pay | Admitting: Family Medicine

## 2019-05-24 DIAGNOSIS — F419 Anxiety disorder, unspecified: Secondary | ICD-10-CM

## 2019-05-24 DIAGNOSIS — F32A Depression, unspecified: Secondary | ICD-10-CM

## 2019-05-25 ENCOUNTER — Other Ambulatory Visit: Payer: Self-pay | Admitting: Family Medicine

## 2019-05-25 DIAGNOSIS — F419 Anxiety disorder, unspecified: Secondary | ICD-10-CM

## 2019-05-25 DIAGNOSIS — F32A Depression, unspecified: Secondary | ICD-10-CM

## 2019-05-25 MED ORDER — ESCITALOPRAM OXALATE 20 MG PO TABS: 20.0000 mg | ORAL_TABLET | Freq: Every day | ORAL | 1 refills | Status: DC

## 2019-05-27 NOTE — Telephone Encounter (Signed)
This was already done

## 2019-05-28 DIAGNOSIS — M25842 Other specified joint disorders, left hand: Secondary | ICD-10-CM | POA: Insufficient documentation

## 2019-05-28 DIAGNOSIS — R2230 Localized swelling, mass and lump, unspecified upper limb: Secondary | ICD-10-CM | POA: Insufficient documentation

## 2019-08-25 ENCOUNTER — Other Ambulatory Visit: Payer: Self-pay | Admitting: Family Medicine

## 2019-08-25 DIAGNOSIS — E785 Hyperlipidemia, unspecified: Secondary | ICD-10-CM

## 2019-11-24 ENCOUNTER — Other Ambulatory Visit: Payer: Self-pay | Admitting: Family Medicine

## 2019-11-24 DIAGNOSIS — E785 Hyperlipidemia, unspecified: Secondary | ICD-10-CM

## 2019-11-24 DIAGNOSIS — F419 Anxiety disorder, unspecified: Secondary | ICD-10-CM

## 2019-11-24 DIAGNOSIS — F32A Depression, unspecified: Secondary | ICD-10-CM

## 2019-11-26 NOTE — Telephone Encounter (Signed)
Called pt and avdised to call office for a med check appt soon. [place]

## 2019-11-26 NOTE — Telephone Encounter (Signed)
He needs a follow up visit. I have not seen him since 01/2019. Ok to give 30 days of medication if he is still taking it daily.

## 2020-01-01 ENCOUNTER — Other Ambulatory Visit: Payer: Self-pay | Admitting: Family Medicine

## 2020-01-01 DIAGNOSIS — F32A Depression, unspecified: Secondary | ICD-10-CM

## 2020-01-01 NOTE — Telephone Encounter (Signed)
Left message for pt to call back to schedule appt then we can refill for 30 days

## 2020-01-02 ENCOUNTER — Telehealth: Payer: Self-pay | Admitting: Family Medicine

## 2020-01-02 ENCOUNTER — Other Ambulatory Visit: Payer: Self-pay | Admitting: Family Medicine

## 2020-01-02 DIAGNOSIS — F32A Depression, unspecified: Secondary | ICD-10-CM

## 2020-01-02 DIAGNOSIS — E785 Hyperlipidemia, unspecified: Secondary | ICD-10-CM

## 2020-01-02 DIAGNOSIS — F419 Anxiety disorder, unspecified: Secondary | ICD-10-CM

## 2020-01-02 MED ORDER — ESCITALOPRAM OXALATE 20 MG PO TABS: 20.0000 mg | ORAL_TABLET | Freq: Every day | ORAL | 1 refills | Status: DC

## 2020-01-02 MED ORDER — LIVALO 2 MG PO TABS: 1.0000 | ORAL_TABLET | Freq: Every day | ORAL | 0 refills | Status: DC

## 2020-01-02 MED ORDER — LIVALO 2 MG PO TABS: 1.0000 | ORAL_TABLET | Freq: Every day | ORAL | 1 refills | Status: DC

## 2020-01-02 NOTE — Telephone Encounter (Signed)
Sent in med

## 2020-01-02 NOTE — Telephone Encounter (Signed)
Pt returned Sabrina's call & scheduled appt for Vickie's first available morning appt and he needs refills on both Livalo and Lexapro

## 2020-01-02 NOTE — Telephone Encounter (Signed)
Sent to pharmacy 

## 2020-01-02 NOTE — Telephone Encounter (Signed)
[facility] is requesting to fill pt lexapro. Please advise [facility]

## 2020-01-29 NOTE — Progress Notes (Signed)
   Subjective:    Patient ID: Francisco Taylor, male    DOB: [birth year], 39 y.o.   MRN: [id number]  HPI Chief Complaint  Patient presents with  . med check    med check    He is here for a medication management visit.   Anxiety and depression -states his mood is good.  Would like to continue on Lexapro. Is not in therapy.   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   Left upper trapezius spasms. States it is not interfering with his daily activities.  Hx of hairline fractures of cervical spine. He is concerned that he has cervical issues. numbness, tingling, or weakness. Occasional sharp pain radiates down his left arm.   Elevated LDL- 200 stated on Livalo and reports taking this daily. He has not followed up.  Has declined statin in the past. States he took atorvastatin and had severe muscle aches.   Smoker -1 1/2 ppd   He ate a bagel appox 2 1/2 hrs prior   Diet is fairly health. Eating at home.  Job is very active.   EGD and colonoscopy at [facility] in September 2020 for chronic diarrhea.   Requests prescription for Epi-pens due to bee allergies. Works outside as Games developer.   Denies fever, chills, dizziness, chest pain, palpitations, shortness of breath, abdominal pain, N/V/D, urinary symptoms, LE edema.   Reviewed allergies, medications, past medical, surgical, family, and social history.     Review of Systems Pertinent positives and negatives in the history of present illness.     Objective:   Physical Exam Constitutional:      Appearance: Normal appearance.  Cardiovascular:     Rate and Rhythm: Normal rate and regular rhythm.     Pulses: Normal pulses.     Heart sounds: Normal heart sounds.  Pulmonary:     Effort: Pulmonary effort is normal.     Breath sounds: Normal breath sounds.  Musculoskeletal:        General: Normal range of motion.     Cervical back: Normal range of motion and neck supple. No tenderness.  Lymphadenopathy:     Cervical: No  cervical adenopathy.  Skin:    General: Skin is warm and dry.  Neurological:     General: No focal deficit present.     Mental Status: He is alert and oriented to person, place, and time.     Cranial Nerves: No cranial nerve deficit.     Sensory: No sensory deficit.     Motor: No weakness.    BP 120/70   Pulse 80   Wt 206 lb 6.4 oz (93.6 kg)   BMI 25.80 kg/m        Assessment & Plan:  Anxiety and depression - Plan: CBC with Differential/Platelet, Comprehensive metabolic panel, escitalopram (LEXAPRO) 20 MG tablet -controlled. Continue medication. Declines counseling for now.   Smoker- is not ready to stop. Discussed this is risk factor for heart disease.   Hypercholesterolemia with LDL greater than 190 mg/dL - Plan: Lipid panel -reports taking Livalo daily. Will follow up pending lipids. He was not fasting.   Chronic neck pain - Plan: DG Cervical Spine Complete -check XR and follow up. Declines further work up at this time. States pain is not interfering with work or life.   Medication management - Plan: Lipid panel  Allergic to bees

## 2020-01-30 ENCOUNTER — Encounter: Payer: Self-pay | Admitting: Family Medicine

## 2020-01-30 ENCOUNTER — Ambulatory Visit
Admission: RE | Admit: 2020-01-30 | Discharge: 2020-01-30 | Disposition: A | Discharge status: Home / Self Care | Payer: BLUE CROSS/BLUE SHIELD | Source: Ambulatory Visit | Attending: Family Medicine | Admitting: Family Medicine

## 2020-01-30 ENCOUNTER — Other Ambulatory Visit: Payer: Self-pay

## 2020-01-30 ENCOUNTER — Ambulatory Visit: Payer: BLUE CROSS/BLUE SHIELD | Admitting: Family Medicine

## 2020-01-30 VITALS — BP 120/70 | HR 80 | Wt 206.4 lb

## 2020-01-30 DIAGNOSIS — F419 Anxiety disorder, unspecified: Secondary | ICD-10-CM | POA: Diagnosis not present

## 2020-01-30 DIAGNOSIS — E7800 Pure hypercholesterolemia, unspecified: Secondary | ICD-10-CM | POA: Diagnosis not present

## 2020-01-30 DIAGNOSIS — F17200 Nicotine dependence, unspecified, uncomplicated: Secondary | ICD-10-CM | POA: Diagnosis not present

## 2020-01-30 DIAGNOSIS — G8929 Other chronic pain: Secondary | ICD-10-CM

## 2020-01-30 DIAGNOSIS — F329 Major depressive disorder, single episode, unspecified: Secondary | ICD-10-CM

## 2020-01-30 DIAGNOSIS — Z5320 Procedure and treatment not carried out because of patient's decision for unspecified reasons: Secondary | ICD-10-CM

## 2020-01-30 DIAGNOSIS — Z79899 Other long term (current) drug therapy: Secondary | ICD-10-CM

## 2020-01-30 DIAGNOSIS — M542 Cervicalgia: Secondary | ICD-10-CM

## 2020-01-30 DIAGNOSIS — F32A Depression, unspecified: Secondary | ICD-10-CM

## 2020-01-30 DIAGNOSIS — Z91030 Bee allergy status: Secondary | ICD-10-CM

## 2020-01-30 LAB — COMPREHENSIVE METABOLIC PANEL
ALT: 25 IU/L (ref 0–44)
AST: 21 IU/L (ref 0–40)
Albumin/Globulin Ratio: 2.9 — ABNORMAL HIGH (ref 1.2–2.2)
Albumin: 5 g/dL (ref 4.0–5.0)
Alkaline Phosphatase: 83 IU/L (ref 48–121)
BUN/Creatinine Ratio: 14 (ref 9–20)
BUN: 13 mg/dL (ref 6–20)
Bilirubin Total: 0.4 mg/dL (ref 0.0–1.2)
CO2: 27 mmol/L (ref 20–29)
Calcium: 9.5 mg/dL (ref 8.7–10.2)
Chloride: 102 mmol/L (ref 96–106)
Creatinine, Ser: 0.95 mg/dL (ref 0.76–1.27)
GFR calc Af Amer: 118 mL/min/{1.73_m2} (ref >59)
GFR calc non Af Amer: 102 mL/min/{1.73_m2} (ref >59)
Globulin, Total: 1.7 g/dL (ref 1.5–4.5)
Glucose: 70 mg/dL (ref 65–99)
Potassium: 4.7 mmol/L (ref 3.5–5.2)
Sodium: 141 mmol/L (ref 134–144)
Total Protein: 6.7 g/dL (ref 6.0–8.5)

## 2020-01-30 LAB — LIPID PANEL
Chol/HDL Ratio: 3.7 ratio (ref 0.0–5.0)
Cholesterol, Total: 202 mg/dL — ABNORMAL HIGH (ref 100–199)
HDL: 54 mg/dL (ref >39)
LDL Chol Calc (NIH): 123 mg/dL — ABNORMAL HIGH (ref 0–99)
Triglycerides: 143 mg/dL (ref 0–149)
VLDL Cholesterol Cal: 25 mg/dL (ref 5–40)

## 2020-01-30 LAB — CBC WITH DIFFERENTIAL/PLATELET
Basophils Absolute: 0.1 10*3/uL (ref 0.0–0.2)
Basos: 1 %
EOS (ABSOLUTE): 0.1 10*3/uL (ref 0.0–0.4)
Eos: 2 %
Hematocrit: 50.2 % (ref 37.5–51.0)
Hemoglobin: 16.9 g/dL (ref 13.0–17.7)
Immature Grans (Abs): 0 10*3/uL (ref 0.0–0.1)
Immature Granulocytes: 0 %
Lymphocytes Absolute: 2 10*3/uL (ref 0.7–3.1)
Lymphs: 29 %
MCH: 31.5 pg (ref 26.6–33.0)
MCHC: 33.7 g/dL (ref 31.5–35.7)
MCV: 94 fL (ref 79–97)
Monocytes Absolute: 0.6 10*3/uL (ref 0.1–0.9)
Monocytes: 9 %
Neutrophils Absolute: 4.1 10*3/uL (ref 1.4–7.0)
Neutrophils: 59 %
Platelets: 171 10*3/uL (ref 150–450)
RBC: 5.36 x10E6/uL (ref 4.14–5.80)
RDW: 12.5 % (ref 11.6–15.4)
WBC: 6.9 10*3/uL (ref 3.4–10.8)

## 2020-01-30 MED ORDER — EPINEPHRINE 0.3 MG/0.3ML IJ SOAJ: 0.3000 mg | INTRAMUSCULAR | 0 refills | Status: DC | PRN

## 2020-01-30 MED ORDER — ESCITALOPRAM OXALATE 20 MG PO TABS: 20.0000 mg | ORAL_TABLET | Freq: Every day | ORAL | 5 refills | Status: DC

## 2020-01-30 NOTE — Progress Notes (Signed)
His X ray is negative.

## 2020-01-31 NOTE — Progress Notes (Signed)
His blood work is ok. His cholesterol has improved significantly. Continue on the Livalo.

## 2020-02-02 ENCOUNTER — Other Ambulatory Visit: Payer: Self-pay | Admitting: Family Medicine

## 2020-02-02 DIAGNOSIS — F419 Anxiety disorder, unspecified: Secondary | ICD-10-CM

## 2020-02-02 DIAGNOSIS — F32A Depression, unspecified: Secondary | ICD-10-CM

## 2020-02-29 ENCOUNTER — Telehealth: Payer: Self-pay | Admitting: Family Medicine

## 2020-02-29 NOTE — Telephone Encounter (Signed)
P.A. LIVALO  

## 2020-03-01 ENCOUNTER — Other Ambulatory Visit: Payer: Self-pay | Admitting: Family Medicine

## 2020-03-01 DIAGNOSIS — E785 Hyperlipidemia, unspecified: Secondary | ICD-10-CM

## 2020-03-05 NOTE — Telephone Encounter (Signed)
Per Cover my meds unable to process, approval on file.  Called pharmacy & paid claim 02/27/20 at [facility]

## 2020-04-01 ENCOUNTER — Telehealth: Payer: Self-pay

## 2020-04-01 NOTE — Telephone Encounter (Signed)
P.A. LIVALO

## 2020-04-17 NOTE — Telephone Encounter (Signed)
P.A. approved til 10//28/21, pt informed, went thru pharmacy for $18

## 2020-05-07 ENCOUNTER — Other Ambulatory Visit: Payer: Self-pay | Admitting: Family Medicine

## 2020-05-07 DIAGNOSIS — E785 Hyperlipidemia, unspecified: Secondary | ICD-10-CM

## 2020-05-07 DIAGNOSIS — F32A Depression, unspecified: Secondary | ICD-10-CM

## 2020-05-07 DIAGNOSIS — F419 Anxiety disorder, unspecified: Secondary | ICD-10-CM

## 2020-07-06 ENCOUNTER — Other Ambulatory Visit: Payer: Self-pay | Admitting: Family Medicine

## 2020-07-06 DIAGNOSIS — E785 Hyperlipidemia, unspecified: Secondary | ICD-10-CM

## 2020-08-05 ENCOUNTER — Other Ambulatory Visit: Payer: Self-pay | Admitting: Family Medicine

## 2020-08-05 DIAGNOSIS — F32A Depression, unspecified: Secondary | ICD-10-CM

## 2020-08-05 NOTE — Telephone Encounter (Signed)
Will call pt to schedule

## 2020-08-05 NOTE — Telephone Encounter (Signed)
Left detailed message for pt to call back to schedule an appt 

## 2020-08-11 ENCOUNTER — Telehealth: Payer: Self-pay | Admitting: Family Medicine

## 2020-08-11 NOTE — Telephone Encounter (Signed)
Pt needs refill on [facility]italopram sent to the [facility] in [place]. He is out of this medication and is scheduled for med check on 3/14

## 2020-08-11 NOTE — Telephone Encounter (Signed)
Ok to give him 3 months of escitalopram and I would like for him to return for a fasting CPE sometime in the next 2-3 months.

## 2020-08-12 ENCOUNTER — Other Ambulatory Visit: Payer: Self-pay

## 2020-08-12 DIAGNOSIS — F419 Anxiety disorder, unspecified: Secondary | ICD-10-CM

## 2020-08-12 DIAGNOSIS — F32A Depression, unspecified: Secondary | ICD-10-CM

## 2020-08-12 MED ORDER — ESCITALOPRAM OXALATE 20 MG PO TABS: 20.0000 mg | ORAL_TABLET | Freq: Every day | ORAL | 2 refills | Status: DC

## 2020-08-12 NOTE — Telephone Encounter (Signed)
I refilled pts. Medicine and put note in apt desk to schedule CPE at check out.

## 2020-08-24 ENCOUNTER — Encounter: Payer: BLUE CROSS/BLUE SHIELD | Admitting: Family Medicine

## 2020-09-01 ENCOUNTER — Encounter: Payer: BLUE CROSS/BLUE SHIELD | Admitting: Family Medicine

## 2020-09-16 NOTE — Progress Notes (Signed)
   Subjective:    Patient ID: Francisco Taylor, male    DOB: February 02, 1981, 40 y.o.   MRN: [id number]  HPI Chief Complaint  Patient presents with  . med check    Med check    He is here for a medication management visit.   Anxiety and depression- taking Lexapro daily and doing well.   LDL >190 and on statin, tolerating Livalo  Smoking and is not ready to stop.   No new concerns.   Denies fever, chills, dizziness, chest pain, palpitations, shortness of breath, abdominal pain, N/V/D, urinary symptoms, LE edema.    Review of Systems Pertinent positives and negatives in the history of present illness.     Objective:   Physical Exam BP 124/70   Pulse 87   Wt 207 lb 9.6 oz (94.2 kg)   BMI 25.95 kg/m   Alert and oriented and in no acute distress. Respirations unlabored. Normal speech and mood.       Assessment & Plan:  Anxiety and depression  Hypercholesterolemia with LDL greater than 190 mg/dL  Smoker  Recommend he continue current medications since he is doing well.  He is not fasting so no labs today.  Not ready to stop smoking.  Follow up for fasting CPE when due.

## 2020-09-17 ENCOUNTER — Other Ambulatory Visit: Payer: Self-pay

## 2020-09-17 ENCOUNTER — Ambulatory Visit: Payer: BLUE CROSS/BLUE SHIELD | Admitting: Family Medicine

## 2020-09-17 ENCOUNTER — Encounter: Payer: Self-pay | Admitting: Family Medicine

## 2020-09-17 VITALS — BP 124/70 | HR 87 | Wt 207.6 lb

## 2020-09-17 DIAGNOSIS — F17200 Nicotine dependence, unspecified, uncomplicated: Secondary | ICD-10-CM | POA: Diagnosis not present

## 2020-09-17 DIAGNOSIS — F419 Anxiety disorder, unspecified: Secondary | ICD-10-CM

## 2020-09-17 DIAGNOSIS — E7800 Pure hypercholesterolemia, unspecified: Secondary | ICD-10-CM

## 2020-09-17 DIAGNOSIS — F32A Depression, unspecified: Secondary | ICD-10-CM | POA: Diagnosis not present

## 2020-11-03 ENCOUNTER — Other Ambulatory Visit: Payer: Self-pay | Admitting: Family Medicine

## 2020-11-03 DIAGNOSIS — E785 Hyperlipidemia, unspecified: Secondary | ICD-10-CM

## 2020-11-03 DIAGNOSIS — F32A Depression, unspecified: Secondary | ICD-10-CM

## 2021-01-18 ENCOUNTER — Encounter: Payer: BLUE CROSS/BLUE SHIELD | Admitting: Family Medicine

## 2021-02-08 ENCOUNTER — Other Ambulatory Visit: Payer: Self-pay | Admitting: Family Medicine

## 2021-02-08 DIAGNOSIS — F419 Anxiety disorder, unspecified: Secondary | ICD-10-CM

## 2021-02-08 DIAGNOSIS — E785 Hyperlipidemia, unspecified: Secondary | ICD-10-CM

## 2021-02-08 DIAGNOSIS — F32A Depression, unspecified: Secondary | ICD-10-CM

## 2021-02-16 IMAGING — CR DG CERVICAL SPINE COMPLETE 4+V
5 series · 5 of 5 positions shown · non-contrast
Comparison: None.

CLINICAL DATA: Neck pain

EXAM:
CERVICAL SPINE - COMPLETE 4+ VIEW

[w cervical spine lat]
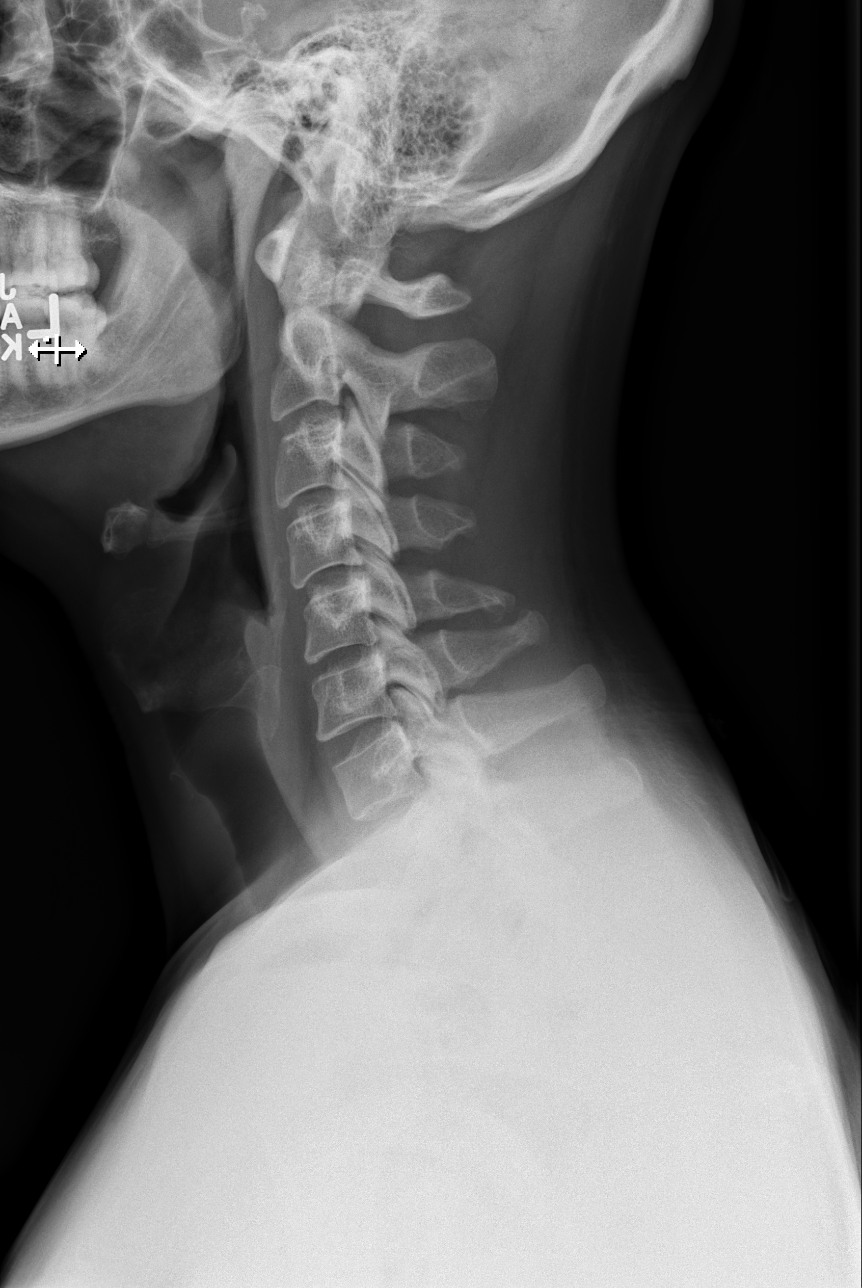

[w cervical spine ap_obl (1 of 2)]
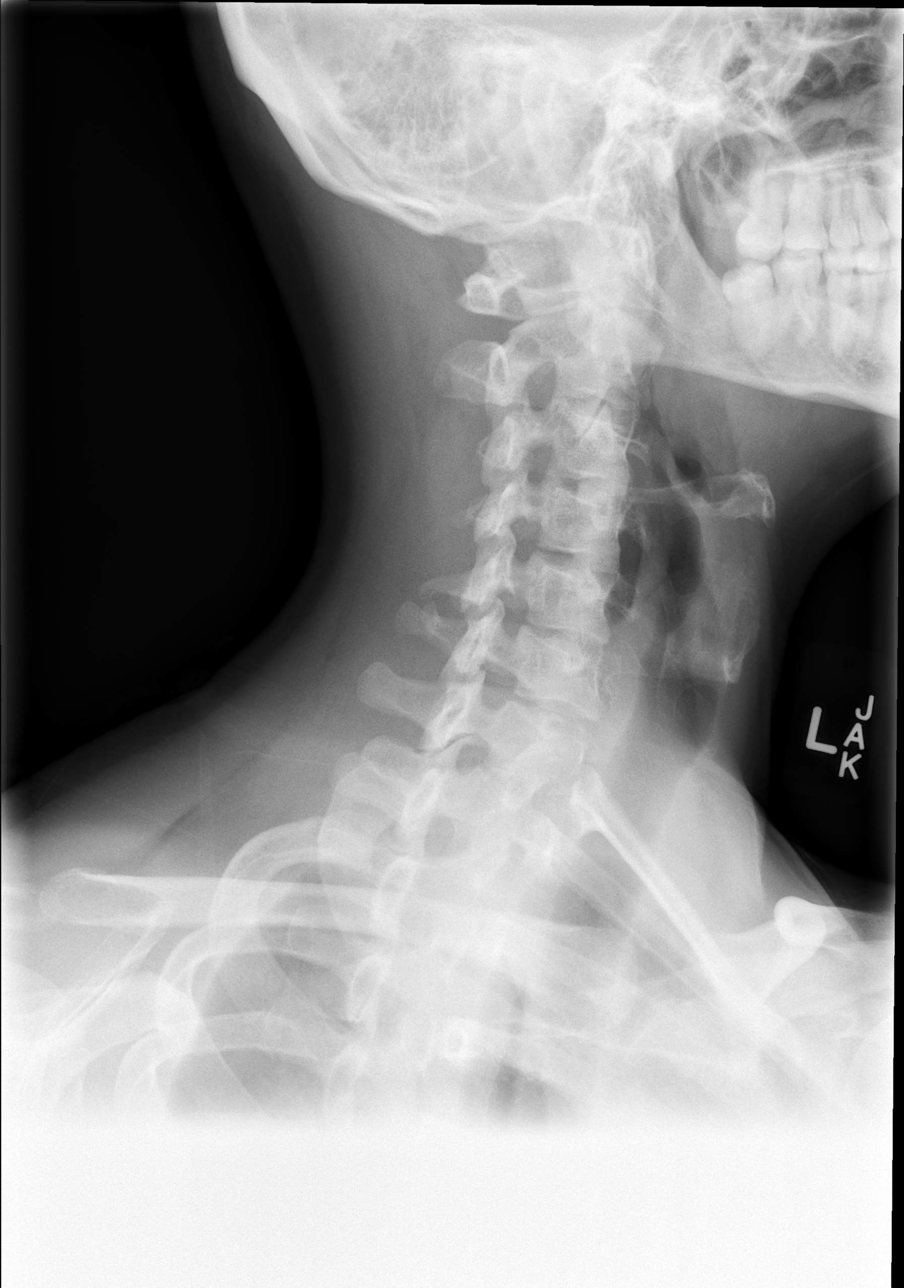

[w cervical spine ap_obl (2 of 2)]
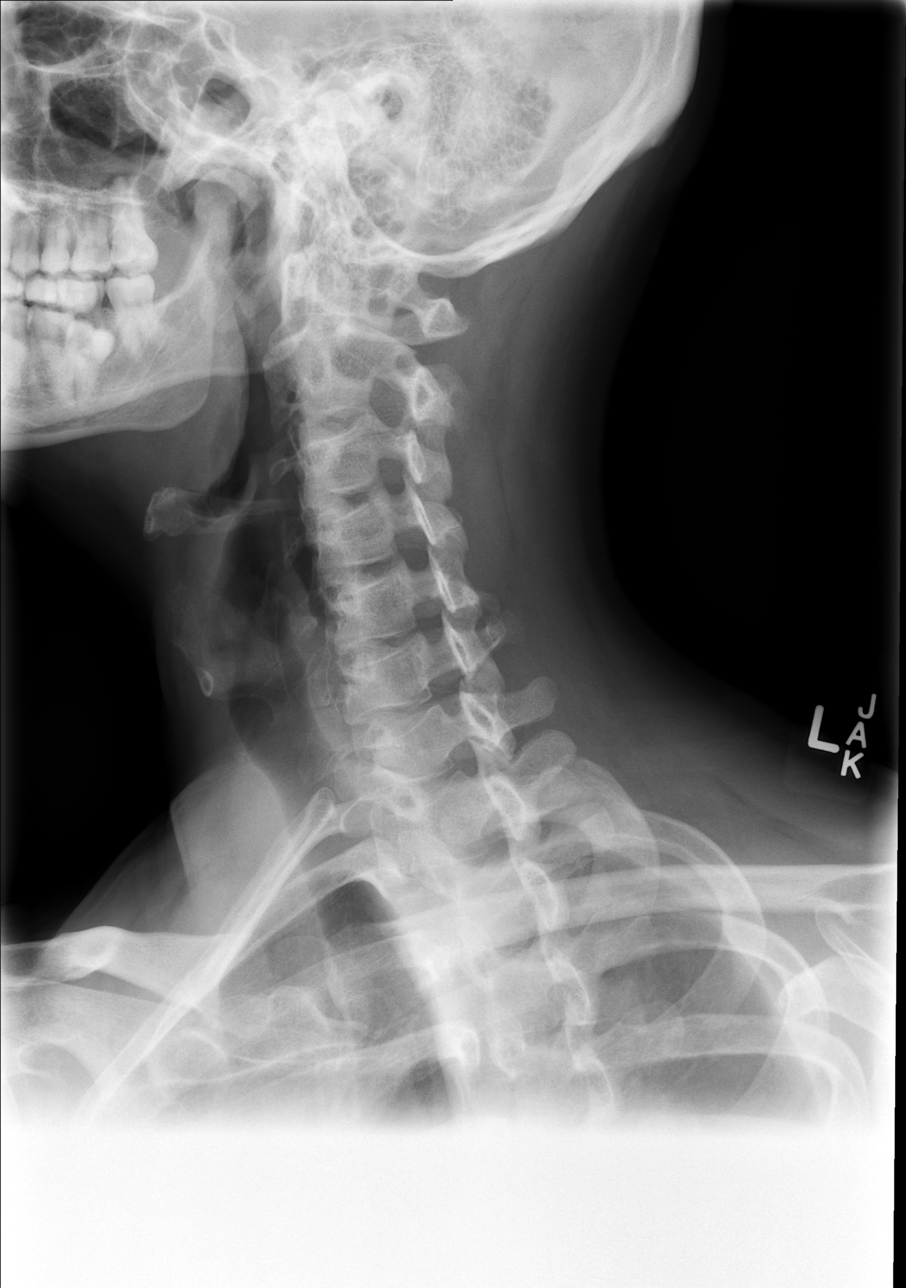

[w cervical spine ap]
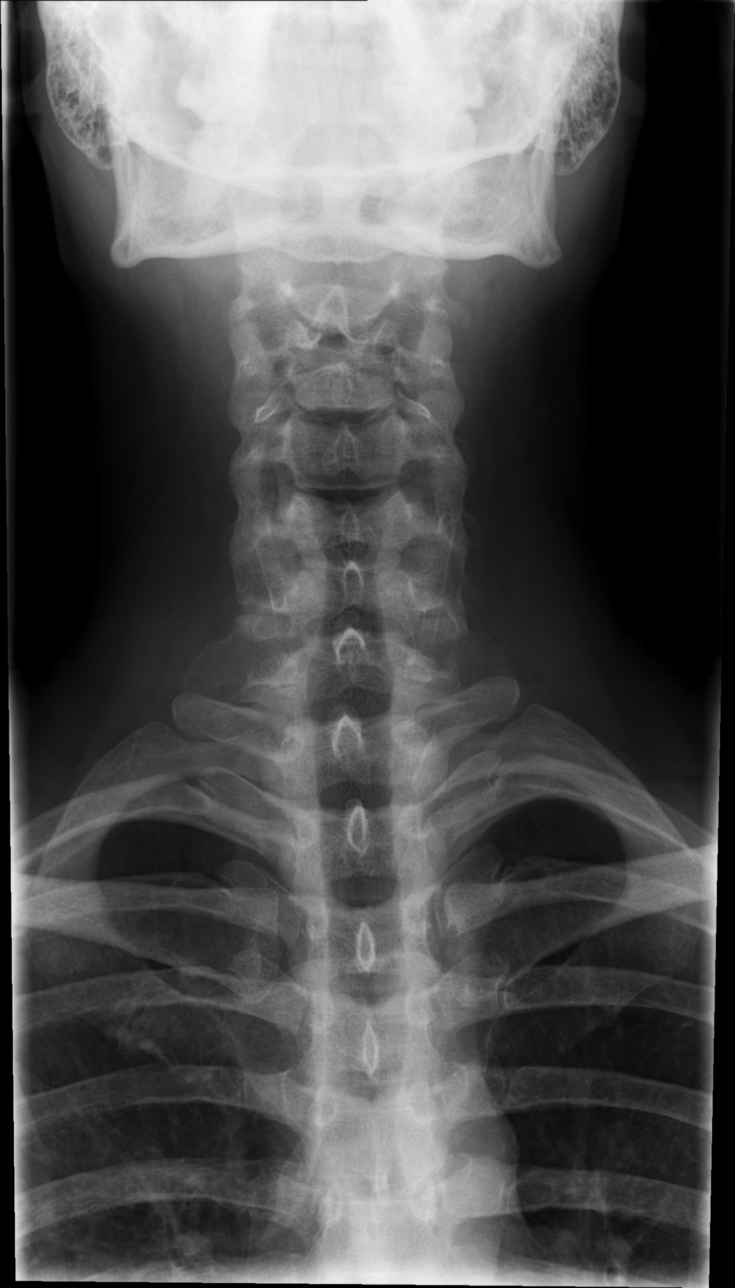

[w cervical spine odontoid]
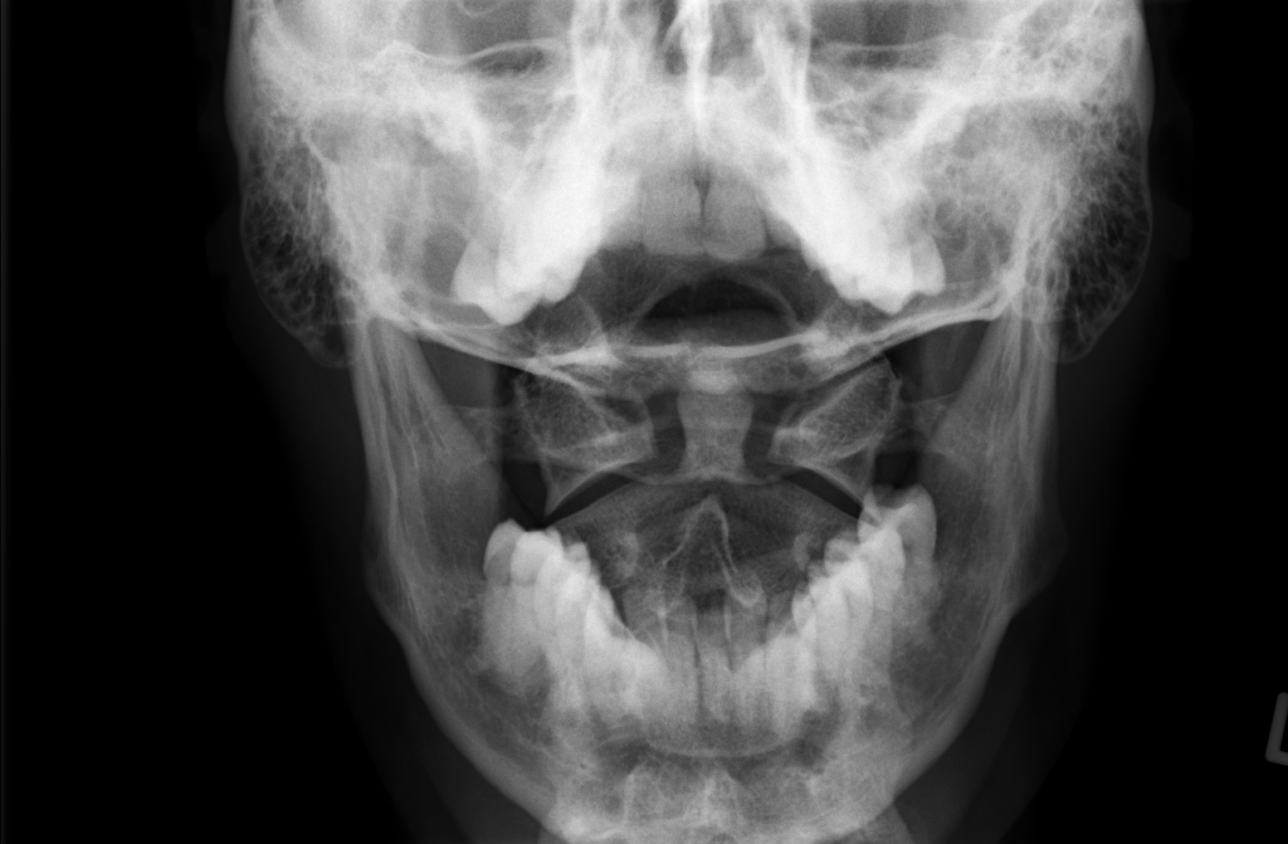

[5 of 5 positions shown; findings below may reference images not displayed]

FINDINGS: There is no evidence of cervical spine fracture or prevertebral soft
tissue swelling. Intact odontoid. Alignment is normal. Patent bony
neural foramen. No other significant bone abnormalities are
identified.
IMPRESSION: Negative cervical spine radiographs.

## 2021-04-03 ENCOUNTER — Telehealth: Payer: Self-pay

## 2021-04-03 NOTE — Telephone Encounter (Signed)
P.A. LIVALO RENEWAL

## 2021-04-25 NOTE — Telephone Encounter (Signed)
P.A. approved til 04/04/21

## 2021-05-11 ENCOUNTER — Encounter: Payer: BLUE CROSS/BLUE SHIELD | Admitting: Family Medicine

## 2021-05-27 ENCOUNTER — Telehealth: Payer: Self-pay

## 2021-05-27 DIAGNOSIS — E785 Hyperlipidemia, unspecified: Secondary | ICD-10-CM

## 2021-05-27 DIAGNOSIS — F419 Anxiety disorder, unspecified: Secondary | ICD-10-CM

## 2021-05-27 NOTE — Telephone Encounter (Signed)
Pt is out of his Escitalopram & Livalo & will make appt with Jeani Hawking once she is in office.  (He had an appt scheduled with Vickie and it was cancelled by [place])

## 2021-05-28 MED ORDER — LIVALO 2 MG PO TABS: 1.0000 | ORAL_TABLET | Freq: Every day | ORAL | 0 refills | Status: DC

## 2021-05-28 MED ORDER — ESCITALOPRAM OXALATE 20 MG PO TABS
20.0000 mg | ORAL_TABLET | Freq: Every day | ORAL | 0 refills | Status: DC
Start: 2021-05-28 — End: 2021-08-23

## 2021-08-23 ENCOUNTER — Telehealth: Payer: Self-pay

## 2021-08-23 ENCOUNTER — Other Ambulatory Visit: Payer: Self-pay | Admitting: Physician Assistant

## 2021-08-23 ENCOUNTER — Encounter: Payer: Self-pay | Admitting: Physician Assistant

## 2021-08-23 DIAGNOSIS — E782 Mixed hyperlipidemia: Secondary | ICD-10-CM | POA: Insufficient documentation

## 2021-08-23 DIAGNOSIS — Z86010 Personal history of colonic polyps: Secondary | ICD-10-CM | POA: Insufficient documentation

## 2021-08-23 DIAGNOSIS — F419 Anxiety disorder, unspecified: Secondary | ICD-10-CM

## 2021-08-23 DIAGNOSIS — K580 Irritable bowel syndrome with diarrhea: Secondary | ICD-10-CM | POA: Insufficient documentation

## 2021-08-23 DIAGNOSIS — K219 Gastro-esophageal reflux disease without esophagitis: Secondary | ICD-10-CM | POA: Insufficient documentation

## 2021-08-23 DIAGNOSIS — Z860101 Personal history of adenomatous and serrated colon polyps: Secondary | ICD-10-CM | POA: Insufficient documentation

## 2021-08-23 MED ORDER — ESCITALOPRAM OXALATE 20 MG PO TABS: 20.0000 mg | ORAL_TABLET | Freq: Every day | ORAL | 1 refills | Status: DC

## 2021-08-23 MED ORDER — LIVALO 2 MG PO TABS: 1.0000 | ORAL_TABLET | Freq: Every day | ORAL | 1 refills | Status: DC

## 2021-08-23 NOTE — Telephone Encounter (Signed)
Refills sent. Please have patient schedule a yearly exam/follow up appt for 09/18/2021 or after. Thanks.

## 2021-08-23 NOTE — Telephone Encounter (Signed)
Received fax from [facility] pharmacy for a refill on the pts. Escitalopram and livalo last apt was 09/17/20. ?

## 2021-08-23 NOTE — Telephone Encounter (Signed)
Thank you :)

## 2022-02-23 ENCOUNTER — Other Ambulatory Visit: Payer: Self-pay | Admitting: Family Medicine

## 2022-02-23 DIAGNOSIS — E782 Mixed hyperlipidemia: Secondary | ICD-10-CM

## 2022-02-23 DIAGNOSIS — F419 Anxiety disorder, unspecified: Secondary | ICD-10-CM

## 2022-02-24 NOTE — Telephone Encounter (Signed)
Is this okay to refill? 

## 2022-02-28 NOTE — Telephone Encounter (Signed)
[facility] is requesting to fill pt lexapro. He has made a appt with me for 03/31/22. Please advise if you will cover him until then. [place]

## 2022-03-22 ENCOUNTER — Encounter: Payer: Self-pay | Admitting: Internal Medicine

## 2022-03-28 ENCOUNTER — Telehealth: Payer: Self-pay | Admitting: Physician Assistant

## 2022-03-28 NOTE — Telephone Encounter (Signed)
Fax refill request from [facility] request refill Livalo 2 mg & Escitalopram.  Pt has appt 10/19 for med check

## 2022-03-31 ENCOUNTER — Encounter: Payer: Self-pay | Admitting: Medical

## 2022-03-31 ENCOUNTER — Ambulatory Visit: Payer: BLUE CROSS/BLUE SHIELD | Admitting: Medical

## 2022-03-31 VITALS — BP 110/70 | HR 98 | Wt 195.6 lb

## 2022-03-31 DIAGNOSIS — Z0000 Encounter for general adult medical examination without abnormal findings: Secondary | ICD-10-CM | POA: Diagnosis not present

## 2022-03-31 DIAGNOSIS — F411 Generalized anxiety disorder: Secondary | ICD-10-CM

## 2022-03-31 DIAGNOSIS — Z136 Encounter for screening for cardiovascular disorders: Secondary | ICD-10-CM

## 2022-03-31 DIAGNOSIS — Z720 Tobacco use: Secondary | ICD-10-CM | POA: Diagnosis not present

## 2022-03-31 DIAGNOSIS — K589 Irritable bowel syndrome without diarrhea: Secondary | ICD-10-CM

## 2022-03-31 DIAGNOSIS — M79675 Pain in left toe(s): Secondary | ICD-10-CM | POA: Diagnosis not present

## 2022-03-31 DIAGNOSIS — E785 Hyperlipidemia, unspecified: Secondary | ICD-10-CM | POA: Insufficient documentation

## 2022-03-31 DIAGNOSIS — K219 Gastro-esophageal reflux disease without esophagitis: Secondary | ICD-10-CM | POA: Insufficient documentation

## 2022-03-31 DIAGNOSIS — R059 Cough, unspecified: Secondary | ICD-10-CM | POA: Insufficient documentation

## 2022-03-31 DIAGNOSIS — Z87898 Personal history of other specified conditions: Secondary | ICD-10-CM

## 2022-03-31 DIAGNOSIS — Z7185 Encounter for immunization safety counseling: Secondary | ICD-10-CM

## 2022-03-31 DIAGNOSIS — Z579 Occupational exposure to unspecified risk factor: Secondary | ICD-10-CM | POA: Insufficient documentation

## 2022-03-31 LAB — POCT URINALYSIS DIP (PROADVANTAGE DEVICE)
Bilirubin, UA: NEGATIVE
Blood, UA: NEGATIVE
Glucose, UA: NEGATIVE mg/dL
Ketones, POC UA: NEGATIVE mg/dL
Leukocytes, UA: NEGATIVE
Nitrite, UA: NEGATIVE
Protein Ur, POC: NEGATIVE mg/dL
Specific Gravity, Urine: 1.01
Urobilinogen, Ur: NEGATIVE
pH, UA: 7 (ref 5.0–8.0)

## 2022-03-31 NOTE — Progress Notes (Signed)
subjective:   HPI  Francisco Taylor is a 41 y.o. male who presents for Chief Complaint  Patient presents with   fasting med check    Fasting med check, declines flu or covid shot, having some severe pain in left big toe.      Patient Care Team: Tyreece Gelles, Leward Quan as PCP - General (Family Medicine) Sees dentist Sees eye doctor Dr. Carollee Massed, orthopedics, [facility]  Concerns: He notes he hasn't been in a while, due for routine checkup  Seeing ortho, has SLAP tear in right shoulder.  Having surgery soon 05/27/22, [facility].  He works in [employer].  He does note left great hurts all the time for the last several weeks.  Sometimes seems swollen.  No injury or trauma.  He is a long-term smoker, 30-year pack history  Reviewed their medical, surgical, family, social, medication, and allergy history and updated chart as appropriate.  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 Ronnette Juniper   ESOPHAGOGASTRODUODENOSCOPY  02/2019   Dr. Ronnette Juniper    Family History  Problem Relation Age of Onset   Depression Mother    Depression Father    Diabetes Paternal Grandfather      Current Outpatient Medications:    EPINEPHrine 0.15 MG/0.15ML IJ injection, Inject into the muscle., Disp: , Rfl:    escitalopram (LEXAPRO) 20 MG tablet, TAKE ONE TABLET BY MOUTH ONE TIME DAILY, Disp: 30 tablet, Rfl: 0   LIVALO 2 MG TABS, TAKE ONE TABLET BY MOUTH ONE TIME DAILY, Disp: 30 tablet, Rfl: 0  Allergies  Allergen Reactions    Bee Venom Swelling   Covid-19 (Mrna) Vaccine     significant lethargy and illness like symptoms     Review of Systems Constitutional: -fever, -chills, -sweats, -unexpected weight change, -decreased appetite, -fatigue Allergy: -sneezing, -itching, -congestion Dermatology: -changing moles, --rash, -lumps ENT: -runny nose, -ear pain, -sore throat, -hoarseness, -sinus pain, -teeth pain, - ringing in ears, -hearing loss, -nosebleeds Cardiology: -chest pain, -palpitations, -swelling, -difficulty breathing when lying flat, -waking up short of breath Respiratory: -cough, -shortness of breath, -difficulty breathing with exercise or exertion, -wheezing, -coughing up blood Gastroenterology: -abdominal pain, -nausea, -vomiting, -diarrhea, -constipation, -blood in stool, -changes in bowel movement, -difficulty swallowing or eating Hematology: -bleeding, -bruising  Musculoskeletal: +joint aches, -muscle aches, -joint swelling, -back pain, -neck pain, -cramping, -changes in gait Ophthalmology: denies vision changes, eye redness, itching, discharge Urology: -burning with urination, -difficulty urinating, -blood in urine, -urinary frequency, -urgency, -incontinence Neurology: -headache, -weakness, -tingling, -numbness, -memory loss, -falls, -dizziness Psychology: -depressed mood, -agitation, -sleep problems Male GU: no testicular mass, pain, no lymph nodes swollen, no swelling, no rash.     03/31/2022   11:54 AM 09/17/2020    4:22 PM 01/30/2020   10:37 AM 06/25/2018    1:56 PM  Depression screen PHQ 2/9  Decreased Interest 0 0 0 0  Down, Depressed, Hopeless 0 0 0 0  PHQ - 2  Score 0 0 0 0        Objective:  BP 110/70   Pulse 98   Wt 195 lb 9.6 oz (88.7 kg)   BMI 24.45 kg/m   General appearance: alert, no distress, WD/WN, Caucasian male Skin: unremarkable HEENT: normocephalic, conjunctiva/corneas normal, sclerae anicteric, PERRLA, EOMi, nares patent, no discharge or erythema, pharynx  normal Oral cavity: MMM, tongue normal, teeth normal Neck: supple, no lymphadenopathy, no thyromegaly, no masses, normal ROM, no bruits Chest: non tender, normal shape and expansion Heart: RRR, normal S1, S2, no murmurs Lungs: CTA bilaterally, no wheezes, rhonchi, or rales Abdomen: +bs, soft, non tender, non distended, no masses, no hepatomegaly, no splenomegaly, no bruits Back: non tender, normal ROM, no scoliosis Musculoskeletal: bony arthritis change left great toe MTP, no swelling or erythema, mild pain with ROM, otherwise upper extremities non tender, no obvious deformity, normal ROM throughout, lower extremities non tender, no obvious deformity, normal ROM throughout Extremities: no edema, no cyanosis, no clubbing Pulses: 2+ symmetric, upper and lower extremities, normal cap refill Neurological: alert, oriented x 3, CN2-12 intact, strength normal upper extremities and lower extremities, sensation normal throughout, DTRs 2+ throughout, no cerebellar signs, gait normal Psychiatric: normal affect, behavior normal, pleasant  GU/rectal - deferred  EKG reviewed  Assessment and Plan :   Encounter Diagnoses  Name Primary?   Encounter for health maintenance examination in adult Yes   Great toe pain, left    History of alcohol consumption    Tobacco use    Hyperlipidemia, unspecified hyperlipidemia type    Irritable bowel syndrome, unspecified type    Gastroesophageal reflux disease, unspecified whether esophagitis present    Generalized anxiety disorder    Vaccine counseling    Cough, unspecified type    Occupational exposure in workplace    Screening for heart disease     This visit was a preventative care visit, also known as wellness visit or routine physical.   Topics typically include healthy lifestyle, diet, exercise, preventative care, vaccinations, sick and well care, proper use of emergency dept and after hours care, as well as other concerns.      Recommendations: Continue to return yearly for your annual wellness and preventative care visits.  This gives [place] a chance to discuss healthy lifestyle, exercise, vaccinations, review your chart record, and perform screenings where appropriate.  I recommend you see your eye doctor yearly for routine vision care.  I recommend you see your dentist yearly for routine dental care including hygiene visits twice yearly.   Vaccination recommendations were reviewed Immunization History  Administered Date(s) Administered   Influenza,inj,Quad PF,6+ Mos 06/25/2018, 04/06/2019   Tdap 09/28/2018   I recommend flu, covid, and pneumococcal vaccines   Screening for cancer: Colon cancer screening: I reviewed your colonoscopy on file that is up to date from 2020  Testicular cancer screening You should do a monthly self testicular exam if you are between 63-31 years old  We discussed PSA, prostate exam, and prostate cancer screening risks/benefits.   Age 69  Skin cancer screening: Check your skin regularly for new changes, growing lesions, or other lesions of concern Come in for evaluation if you have skin lesions of concern.  Lung cancer screening: If you have a greater than 20 pack year history of tobacco use, then you may qualify for lung cancer screening with a chest CT scan.   Please call your insurance company to inquire about coverage for this test.  Please go to [facility] for your  chest xray.   Their hours are 8am - 4:30 pm Monday - Friday.  Take your insurance card with you.  [facility] [phone]  [address], [place], [place] [address]  [address], [place] [address]   We currently don't have screenings for other cancers besides breast, cervical, colon, and lung cancers.  If you have a strong family history of cancer or have other cancer screening concerns, please let me know.    Bone health: Get at least 150 minutes of aerobic  exercise weekly Get weight bearing exercise at least once weekly Bone density test:  A bone density test is an imaging test that uses a type of X-ray to measure the amount of calcium and other minerals in your bones. The test may be used to diagnose or screen you for a condition that causes weak or thin bones (osteoporosis), predict your risk for a broken bone (fracture), or determine how well your osteoporosis treatment is working. The bone density test is recommended for females 89 and older, or females or males <52 if certain risk factors such as thyroid disease, long term use of steroids such as for asthma or rheumatological issues, vitamin D deficiency, estrogen deficiency, family history of osteoporosis, self or family history of fragility fracture in first degree relative.    Heart health: Get at least 150 minutes of aerobic exercise weekly Limit alcohol It is important to maintain a healthy blood pressure and healthy cholesterol numbers  Heart disease screening: Screening for heart disease includes screening for blood pressure, fasting lipids, glucose/diabetes screening, BMI height to weight ratio, reviewed of smoking status, physical activity, and diet.    Goals include blood pressure 120/80 or less, maintaining a healthy lipid/cholesterol profile, preventing diabetes or keeping diabetes numbers under good control, not smoking or using tobacco products, exercising most days per week or at least 150 minutes per week of exercise, and eating healthy variety of fruits and vegetables, healthy oils, and avoiding unhealthy food choices like fried food, fast food, high sugar and high cholesterol foods.    Other tests may possibly include EKG test, CT coronary calcium score, echocardiogram, exercise treadmill stress test.    Medical care options: I recommend you continue to seek care here first for routine care.  We try really hard to have available appointments Monday through Friday daytime  hours for sick visits, acute visits, and physicals.  Urgent care should be used for after hours and weekends for significant issues that cannot wait till the next day.  The emergency department should be used for significant potentially life-threatening emergencies.  The emergency department is expensive, can often have long wait times for less significant concerns, so try to utilize primary care, urgent care, or telemedicine when possible to avoid unnecessary trips to the emergency department.  Virtual visits and telemedicine have been introduced since the pandemic started in 2020, and can be convenient ways to receive medical care.  We offer virtual appointments as well to assist you in a variety of options to seek medical care.   Advanced Directives: I recommend you consider completing a [place] and Living Will.   These documents respect your wishes and help alleviate burdens on your loved ones if you were to become terminally ill or be in a position to need those documents enforced.    You can complete Advanced Directives yourself, have them notarized, then have copies made for our office, for you and for anybody you  feel should have them in safe keeping.  Or, you can have an attorney prepare these documents.   If you haven't updated your Last Will and Testament in a while, it may be worthwhile having an attorney prepare these documents together and save on some costs.       Separate significant issues discussed: Hyperlipidemia-updated labs today, compliant with medication  Tobacco use-I strongly recommend you think about quitting smoking.  We can help with medication and quit smoking.  I strongly recommend limiting or cutting down on alcohol use.  Alcohol use excessive can be you at risk for cirrhosis of liver, esophageal and stomach cancer, gout and other problems.  Great toe pain-additional eval today but likely some arthritis.  You can use over-the-counter creams such  as Aspercreme or capsicin cream for comfort, wear good supportive footwear  Given work exposure risk, lead level done at his request.  Kevon was seen today for fasting med check.  Diagnoses and all orders for this visit:  Encounter for health maintenance examination in adult -     DG Chest 2 View; Future -     Comprehensive metabolic panel -     CBC -     Lipid panel -     POCT Urinalysis DIP (Proadvantage Device) -     TSH -     Vitamin B1 -     Uric acid -     Lead, blood -     EKG 12-Lead  Great toe pain, left -     Uric acid  History of alcohol consumption -     TSH -     Vitamin B1 -     Uric acid  Tobacco use -     DG Chest 2 View; Future  Hyperlipidemia, unspecified hyperlipidemia type -     Lipid panel  Irritable bowel syndrome, unspecified type  Gastroesophageal reflux disease, unspecified whether esophagitis present  Generalized anxiety disorder  Vaccine counseling  Cough, unspecified type -     DG Chest 2 View; Future  Occupational exposure in workplace -     Lead, blood  Screening for heart disease -     EKG 12-Lead    Follow-up pending labs, yearly for physical

## 2022-03-31 NOTE — Patient Instructions (Signed)
This visit was a preventative care visit, also known as wellness visit or routine physical.   Topics typically include healthy lifestyle, diet, exercise, preventative care, vaccinations, sick and well care, proper use of emergency dept and after hours care, as well as other concerns.     Recommendations: Continue to return yearly for your annual wellness and preventative care visits.  This gives [place] a chance to discuss healthy lifestyle, exercise, vaccinations, review your chart record, and perform screenings where appropriate.  I recommend you see your eye doctor yearly for routine vision care.  I recommend you see your dentist yearly for routine dental care including hygiene visits twice yearly.   Vaccination recommendations were reviewed Immunization History  Administered Date(s) Administered   Influenza,inj,Quad PF,6+ Mos 06/25/2018, 04/06/2019   Tdap 09/28/2018   I recommend flu, covid, and pneumococcal vaccines   Screening for cancer: Colon cancer screening: I reviewed your colonoscopy on file that is up to date from 2020  Testicular cancer screening You should do a monthly self testicular exam if you are between 52-28 years old  We discussed PSA, prostate exam, and prostate cancer screening risks/benefits.   Age 39  Skin cancer screening: Check your skin regularly for new changes, growing lesions, or other lesions of concern Come in for evaluation if you have skin lesions of concern.  Lung cancer screening: If you have a greater than 20 pack year history of tobacco use, then you may qualify for lung cancer screening with a chest CT scan.   Please call your insurance company to inquire about coverage for this test.  Please go to [facility] for your chest xray.   Their hours are 8am - 4:30 pm Monday - Friday.  Take your insurance card with you.  [facility] [phone]  [address], [place], [place] [address]  [address], [place] [address]   We currently don't have screenings for other cancers besides breast, cervical, colon, and lung cancers.  If you have a strong family history of cancer or have other cancer screening concerns, please let me know.    Bone health: Get at least 150 minutes of aerobic exercise weekly Get weight bearing exercise at least once weekly Bone density test:  A bone density test is an imaging test that uses a type of X-ray to measure the amount of calcium and other minerals in your bones. The test may be used to diagnose or screen you for a condition that causes weak or thin bones (osteoporosis), predict your risk for a broken bone (fracture), or determine how well your osteoporosis treatment is working. The bone density test is recommended for females 18 and older, or females or males <99 if certain risk factors such as thyroid disease, long term use of steroids such as for asthma or rheumatological issues, vitamin D deficiency, estrogen deficiency, family history of osteoporosis, self or family history of fragility fracture in first degree relative.    Heart health: Get at least 150 minutes of aerobic exercise weekly Limit alcohol It is important to maintain a healthy blood pressure and healthy cholesterol numbers  Heart disease screening: Screening for heart disease includes screening for blood pressure, fasting lipids, glucose/diabetes screening, BMI height to weight ratio, reviewed of smoking status, physical activity, and diet.    Goals include blood pressure 120/80 or less, maintaining a healthy lipid/cholesterol profile, preventing diabetes or keeping diabetes numbers under good control, not smoking or using tobacco products, exercising most days  per week or at least 150 minutes per week of exercise, and eating healthy variety of fruits and vegetables, healthy oils, and avoiding unhealthy food choices like fried food, fast food, high sugar and high cholesterol foods.     Other tests may possibly include EKG test, CT coronary calcium score, echocardiogram, exercise treadmill stress test.    Medical care options: I recommend you continue to seek care here first for routine care.  We try really hard to have available appointments Monday through Friday daytime hours for sick visits, acute visits, and physicals.  Urgent care should be used for after hours and weekends for significant issues that cannot wait till the next day.  The emergency department should be used for significant potentially life-threatening emergencies.  The emergency department is expensive, can often have long wait times for less significant concerns, so try to utilize primary care, urgent care, or telemedicine when possible to avoid unnecessary trips to the emergency department.  Virtual visits and telemedicine have been introduced since the pandemic started in 2020, and can be convenient ways to receive medical care.  We offer virtual appointments as well to assist you in a variety of options to seek medical care.   Advanced Directives: I recommend you consider completing a [place] and Living Will.   These documents respect your wishes and help alleviate burdens on your loved ones if you were to become terminally ill or be in a position to need those documents enforced.    You can complete Advanced Directives yourself, have them notarized, then have copies made for our office, for you and for anybody you feel should have them in safe keeping.  Or, you can have an attorney prepare these documents.   If you haven't updated your Last Will and Testament in a while, it may be worthwhile having an attorney prepare these documents together and save on some costs.       Separate significant issues discussed: Hyperlipidemia-updated labs today, compliant with medication  Tobacco use-I strongly recommend you think about quitting smoking.  We can help with medication and quit  smoking.  I strongly recommend limiting or cutting down on alcohol use.  Alcohol use excessive can be you at risk for cirrhosis of liver, esophageal and stomach cancer, gout and other problems.  Great toe pain-additional eval today but likely some arthritis.  You can use over-the-counter creams such as Aspercreme or capsicin cream for comfort, wear good supportive footwear

## 2022-04-01 ENCOUNTER — Encounter: Payer: Self-pay | Admitting: Internal Medicine

## 2022-04-01 ENCOUNTER — Other Ambulatory Visit: Payer: Self-pay | Admitting: Medical

## 2022-04-01 DIAGNOSIS — F419 Anxiety disorder, unspecified: Secondary | ICD-10-CM

## 2022-04-01 MED ORDER — ROSUVASTATIN CALCIUM 10 MG PO TABS: 10.0000 mg | ORAL_TABLET | Freq: Every day | ORAL | 0 refills | Status: DC

## 2022-04-01 MED ORDER — NAPROXEN 500 MG PO TABS: 500.0000 mg | ORAL_TABLET | Freq: Two times a day (BID) | ORAL | 0 refills | Status: AC

## 2022-04-01 MED ORDER — EPINEPHRINE 0.15 MG/0.15ML IJ SOAJ: 0.1500 mg | INTRAMUSCULAR | 1 refills | Status: DC | PRN

## 2022-04-01 MED ORDER — ALLOPURINOL 100 MG PO TABS: 100.0000 mg | ORAL_TABLET | Freq: Every day | ORAL | 1 refills | Status: DC

## 2022-04-01 MED ORDER — ESCITALOPRAM OXALATE 20 MG PO TABS: 20.0000 mg | ORAL_TABLET | Freq: Every day | ORAL | 1 refills | Status: DC

## 2022-04-04 LAB — LEAD, BLOOD (ADULT >= 16 YRS): Lead-Whole Blood: 6.8 ug/dL — ABNORMAL HIGH (ref 0.0–3.4)

## 2022-04-04 NOTE — Progress Notes (Signed)
Left message for pt to call back to do recommendation

## 2022-04-06 LAB — COMPREHENSIVE METABOLIC PANEL
ALT: 25 IU/L (ref 0–44)
AST: 24 IU/L (ref 0–40)
Albumin/Globulin Ratio: 2.6 — ABNORMAL HIGH (ref 1.2–2.2)
Albumin: 5.1 g/dL (ref 4.1–5.1)
Alkaline Phosphatase: 83 IU/L (ref 44–121)
BUN/Creatinine Ratio: 16 (ref 9–20)
BUN: 14 mg/dL (ref 6–20)
Bilirubin Total: 0.3 mg/dL (ref 0.0–1.2)
CO2: 23 mmol/L (ref 20–29)
Calcium: 9.5 mg/dL (ref 8.7–10.2)
Chloride: 97 mmol/L (ref 96–106)
Creatinine, Ser: 0.88 mg/dL (ref 0.76–1.27)
Globulin, Total: 2 g/dL (ref 1.5–4.5)
Glucose: 101 mg/dL — ABNORMAL HIGH (ref 70–99)
Potassium: 5 mmol/L (ref 3.5–5.2)
Sodium: 142 mmol/L (ref 134–144)
Total Protein: 7.1 g/dL (ref 6.0–8.5)
eGFR: 112 mL/min/{1.73_m2} (ref >59)

## 2022-04-06 LAB — CBC
Hematocrit: 48.9 % (ref 37.5–51.0)
Hemoglobin: 16.5 g/dL (ref 13.0–17.7)
MCH: 30.9 pg (ref 26.6–33.0)
MCHC: 33.7 g/dL (ref 31.5–35.7)
MCV: 92 fL (ref 79–97)
Platelets: 175 10*3/uL (ref 150–450)
RBC: 5.34 x10E6/uL (ref 4.14–5.80)
RDW: 12 % (ref 11.6–15.4)
WBC: 10.8 10*3/uL (ref 3.4–10.8)

## 2022-04-06 LAB — URIC ACID: Uric Acid: 8.8 mg/dL — ABNORMAL HIGH (ref 3.8–8.4)

## 2022-04-06 LAB — LIPID PANEL
Chol/HDL Ratio: 2.8 ratio (ref 0.0–5.0)
Cholesterol, Total: 191 mg/dL (ref 100–199)
HDL: 68 mg/dL (ref >39)
LDL Chol Calc (NIH): 111 mg/dL — ABNORMAL HIGH (ref 0–99)
Triglycerides: 62 mg/dL (ref 0–149)
VLDL Cholesterol Cal: 12 mg/dL (ref 5–40)

## 2022-04-06 LAB — VITAMIN B1: Thiamine: 187 nmol/L (ref 66.5–200.0)

## 2022-04-06 LAB — TSH: TSH: 0.627 u[IU]/mL (ref 0.450–4.500)

## 2022-04-08 ENCOUNTER — Encounter: Payer: Self-pay | Admitting: Internal Medicine

## 2022-04-26 ENCOUNTER — Other Ambulatory Visit: Payer: Self-pay

## 2022-04-26 DIAGNOSIS — E782 Mixed hyperlipidemia: Secondary | ICD-10-CM

## 2022-04-26 MED ORDER — LIVALO 2 MG PO TABS: 1.0000 | ORAL_TABLET | Freq: Every day | ORAL | 2 refills | Status: DC

## 2022-07-05 ENCOUNTER — Telehealth: Payer: Self-pay | Admitting: Medical

## 2022-07-05 NOTE — Telephone Encounter (Signed)
Pharmacy sent refill request for crestor please send to the  [id number] [place], [place]

## 2022-07-07 ENCOUNTER — Telehealth: Payer: Self-pay | Admitting: Medical

## 2022-07-11 MED ORDER — ROSUVASTATIN CALCIUM 10 MG PO TABS: 10.0000 mg | ORAL_TABLET | Freq: Every day | ORAL | 0 refills | Status: DC

## 2022-07-11 NOTE — Telephone Encounter (Signed)
error 

## 2022-07-11 NOTE — Addendum Note (Signed)
Addended by: Minette Headland A on: 07/11/2022 01:58 PM   Modules accepted: Orders

## 2022-07-11 NOTE — Telephone Encounter (Signed)
done

## 2022-10-06 ENCOUNTER — Other Ambulatory Visit: Payer: Self-pay | Admitting: Medical

## 2022-10-06 ENCOUNTER — Telehealth: Payer: Self-pay | Admitting: Medical

## 2022-10-06 DIAGNOSIS — F419 Anxiety disorder, unspecified: Secondary | ICD-10-CM

## 2022-10-06 MED ORDER — ESCITALOPRAM OXALATE 20 MG PO TABS: 20.0000 mg | ORAL_TABLET | Freq: Every day | ORAL | 1 refills | Status: DC

## 2022-10-06 NOTE — Telephone Encounter (Signed)
Pharmacy sent request for lexapro please send to the  [id number] - [place], [place] - [address]

## 2023-04-11 ENCOUNTER — Telehealth: Payer: Self-pay

## 2023-04-11 NOTE — Telephone Encounter (Signed)
Faxed request for escitalopram 20 mg.

## 2023-04-11 NOTE — Telephone Encounter (Signed)
Pt is due for a visit. Been over a year. Can refill 30 days once scheduled

## 2023-04-20 ENCOUNTER — Telehealth: Payer: Self-pay | Admitting: Medical

## 2023-04-20 NOTE — Telephone Encounter (Signed)
Left message for pt to call and schedule

## 2023-04-20 NOTE — Telephone Encounter (Signed)
Pt has not been in, in over a year. Please scheduled before I refill

## 2023-04-20 NOTE — Telephone Encounter (Signed)
Fax from [facility]  Escitalopram 20 mg #90 dispensed 01/09/23

## 2023-04-23 ENCOUNTER — Telehealth: Payer: Self-pay | Admitting: Medical

## 2023-04-23 NOTE — Telephone Encounter (Signed)
Refill request for Escitalopram 20mg  #90 from [facility]

## 2023-04-26 NOTE — Telephone Encounter (Signed)
Left message needs appt.

## 2023-04-27 ENCOUNTER — Other Ambulatory Visit: Payer: Self-pay | Admitting: Medical

## 2023-04-27 ENCOUNTER — Telehealth: Payer: Self-pay | Admitting: Medical

## 2023-04-27 DIAGNOSIS — F32A Depression, unspecified: Secondary | ICD-10-CM

## 2023-04-27 MED ORDER — ESCITALOPRAM OXALATE 20 MG PO TABS: 20.0000 mg | ORAL_TABLET | Freq: Every day | ORAL | 0 refills | Status: DC

## 2023-04-27 NOTE — Telephone Encounter (Signed)
Pt needs refill escitalopram  he is out and is at the "fun, dizzy phase" of been off it  He has fasting medcheck scheduled for Tuesday 11/19

## 2023-05-02 ENCOUNTER — Encounter: Payer: BLUE CROSS/BLUE SHIELD | Admitting: Medical

## 2023-05-03 ENCOUNTER — Telehealth: Payer: Self-pay | Admitting: Medical

## 2023-05-03 NOTE — Telephone Encounter (Signed)
This patient no showed for their appointment today.Which of the following is necessary for this patient.   A) No follow-up necessary   B) Follow-up urgent. Locate Patient Immediately.   C) Follow-up necessary. Contact patient and Schedule visit in ____ Days.   D) Follow-up Advised. Contact patient and Schedule visit in ____ Days.   E) Please Send no show letter to patient. Charge no show fee if no show was a CPE.   

## 2023-05-09 ENCOUNTER — Encounter: Payer: BLUE CROSS/BLUE SHIELD | Admitting: Medical

## 2023-05-16 ENCOUNTER — Ambulatory Visit: Payer: BLUE CROSS/BLUE SHIELD | Admitting: Medical

## 2023-05-16 ENCOUNTER — Encounter: Payer: Self-pay | Admitting: Medical

## 2023-05-16 VITALS — BP 120/78 | HR 81 | Ht 75.0 in | Wt 216.2 lb

## 2023-05-16 DIAGNOSIS — F32A Depression, unspecified: Secondary | ICD-10-CM

## 2023-05-16 DIAGNOSIS — F419 Anxiety disorder, unspecified: Secondary | ICD-10-CM

## 2023-05-16 DIAGNOSIS — E782 Mixed hyperlipidemia: Secondary | ICD-10-CM

## 2023-05-16 DIAGNOSIS — F411 Generalized anxiety disorder: Secondary | ICD-10-CM | POA: Diagnosis not present

## 2023-05-16 DIAGNOSIS — Z87891 Personal history of nicotine dependence: Secondary | ICD-10-CM | POA: Diagnosis not present

## 2023-05-16 DIAGNOSIS — Z77011 Contact with and (suspected) exposure to lead: Secondary | ICD-10-CM

## 2023-05-16 DIAGNOSIS — H9190 Unspecified hearing loss, unspecified ear: Secondary | ICD-10-CM | POA: Insufficient documentation

## 2023-05-16 DIAGNOSIS — E790 Hyperuricemia without signs of inflammatory arthritis and tophaceous disease: Secondary | ICD-10-CM | POA: Insufficient documentation

## 2023-05-16 MED ORDER — EPINEPHRINE 0.15 MG/0.15ML IJ SOAJ: 0.1500 mg | INTRAMUSCULAR | 1 refills | Status: AC | PRN

## 2023-05-16 MED ORDER — ESCITALOPRAM OXALATE 20 MG PO TABS: 20.0000 mg | ORAL_TABLET | Freq: Every day | ORAL | 1 refills | Status: DC

## 2023-05-16 NOTE — Progress Notes (Signed)
Subjective:  Axcel Bhagat is a 42 y.o. male who presents for Chief Complaint  Patient presents with   Anxiety    Patient is here for refill on lexapro. Would also like to get back on Livalo. ZOX0-9     Here for general med check.    He has history of anxiety.  Does fine on Lexapro 20 mg daily.  Needs a refill.  Needs a refill EpiPen to have on hand just in case of allergy  He quit smoking December 2023  He is self-employed as a Electronics engineer.  He does have prior lead exposure and still has the same job risk.  He does use protective equipment though.  Compared to prior years he is cut back on alcohol.  He drinks a few beers per night compared to heavier in the past  He has questions about whether he needs to go back on cholesterol medication.  No heart disease history in the family.  He does not have any children.  He does have a history of gout, gets flareups in his left great toe.  No prior medication for gout prevention.  He feels like his hearing is not as good as it used to be.  No other aggravating or relieving factors.    No other c/o.  Past Medical History:  Diagnosis Date   Anxiety and depression    GERD (gastroesophageal reflux disease)    History of cervical fracture    Hx of lead exposure    Hypercholesterolemia with LDL greater than 190 mg/dL    IBS (irritable bowel syndrome)    Smoker    Statin declined 01/25/2019   Recommended statin on 01/25/19 due to LDL 200   No current outpatient medications on file prior to visit.   No current facility-administered medications on file prior to visit.   Family History  Problem Relation Age of Onset   Depression Mother    Depression Father    Diabetes Paternal Grandfather     The following portions of the patient's history were reviewed and updated as appropriate: allergies, current medications, past family history, past medical history, past social history, past surgical history and problem  list.  ROS Otherwise as in subjective above  Objective: BP 120/78   Pulse 81   Ht 6\' 3"  (1.905 m)   Wt 216 lb 3.2 oz (98.1 kg)   BMI 27.02 kg/m   General appearance: alert, no distress, well developed, well nourished TMs pearly.  No significant wax in either ear canal Neck: supple, no lymphadenopathy, no thyromegaly, no masses Heart: RRR, normal S1, S2, no murmurs Lungs: CTA bilaterally, no wheezes, rhonchi, or rales Pulses: 2+ radial pulses, 2+ pedal pulses, normal cap refill Ext: no edema   Assessment: Encounter Diagnoses  Name Primary?   Generalized anxiety disorder Yes   Decreased hearing, unspecified laterality    Former smoker    Hx of lead exposure    Mixed hyperlipidemia    Elevated uric acid in blood    Anxiety and depression      Plan: Anxiety-doing fine on current therapy.  Continue Lexapro 20 mg daily  Complaint of decreased hearing-hearing screen normal.  Continue wearing protective equipment around mild machinery  Congratulated him on quitting smoking.  Try to limit alcohol more  Hx/o lead exposure - continue to wear protective equipment.  He will return soon for updated labs.  Discussed possible referral to specialist if the levels continue to be elevated  Elevated uric acid in  blood -discussed the potential complications of elevated uric acid.  His level was high last year.  I recommend he continue to avoid excess sugar and excess alcohol in the diet.  If labs still over 6 consider allopurinol.  He will return soon for labs.  Hyperlipidemia-we discussed prior labs and risk for heart disease.  He fortunately is quit smoking now.  We discussed possibly doing a CT coronary heart disease test.  He will let me know if interested in that.  Return soon for fasting labs    Rutherford "Jorja Loa" was seen today for anxiety.  Diagnoses and all orders for this visit:  Generalized anxiety disorder  Decreased hearing, unspecified laterality -      Tympanometry  Former smoker  Hx of lead exposure -     Lead, blood; Future -     CBC; Future -     Comprehensive metabolic panel; Future  Mixed hyperlipidemia -     Lipid panel; Future  Elevated uric acid in blood -     Uric Acid; Future -     CBC; Future  Anxiety and depression -     escitalopram (LEXAPRO) 20 MG tablet; Take 1 tablet (20 mg total) by mouth daily.  Other orders -     EPINEPHrine 0.15 MG/0.15ML IJ injection; Inject 0.15 mg into the muscle as needed for anaphylaxis.    Follow up: Soon for fasting labs

## 2023-05-29 ENCOUNTER — Telehealth: Payer: Self-pay

## 2023-05-29 NOTE — Telephone Encounter (Signed)
Faxed  request for epinephrine 0.15 mg auto-inject #2

## 2023-05-29 NOTE — Telephone Encounter (Signed)
This was already refilled on 05/16/2023

## 2023-06-09 ENCOUNTER — Other Ambulatory Visit: Payer: BLUE CROSS/BLUE SHIELD

## 2023-06-09 DIAGNOSIS — E782 Mixed hyperlipidemia: Secondary | ICD-10-CM

## 2023-06-09 DIAGNOSIS — Z77011 Contact with and (suspected) exposure to lead: Secondary | ICD-10-CM

## 2023-06-09 DIAGNOSIS — E790 Hyperuricemia without signs of inflammatory arthritis and tophaceous disease: Secondary | ICD-10-CM

## 2023-06-10 NOTE — Progress Notes (Signed)
Results sent through MyChart

## 2023-06-13 LAB — COMPREHENSIVE METABOLIC PANEL
ALT: 23 [IU]/L (ref 0–44)
AST: 22 [IU]/L (ref 0–40)
Albumin: 5.1 g/dL (ref 4.1–5.1)
Alkaline Phosphatase: 84 [IU]/L (ref 44–121)
BUN/Creatinine Ratio: 10 (ref 9–20)
BUN: 9 mg/dL (ref 6–24)
Bilirubin Total: 0.4 mg/dL (ref 0.0–1.2)
CO2: 24 mmol/L (ref 20–29)
Calcium: 9.7 mg/dL (ref 8.7–10.2)
Chloride: 100 mmol/L (ref 96–106)
Creatinine, Ser: 0.87 mg/dL (ref 0.76–1.27)
Globulin, Total: 2 g/dL (ref 1.5–4.5)
Glucose: 92 mg/dL (ref 70–99)
Potassium: 4.6 mmol/L (ref 3.5–5.2)
Sodium: 142 mmol/L (ref 134–144)
Total Protein: 7.1 g/dL (ref 6.0–8.5)
eGFR: 112 mL/min/{1.73_m2} (ref >59)

## 2023-06-13 LAB — CBC
Hematocrit: 51.8 % — ABNORMAL HIGH (ref 37.5–51.0)
Hemoglobin: 16.9 g/dL (ref 13.0–17.7)
MCH: 30.1 pg (ref 26.6–33.0)
MCHC: 32.6 g/dL (ref 31.5–35.7)
MCV: 92 fL (ref 79–97)
Platelets: 165 10*3/uL (ref 150–450)
RBC: 5.62 x10E6/uL (ref 4.14–5.80)
RDW: 12.3 % (ref 11.6–15.4)
WBC: 4.6 10*3/uL (ref 3.4–10.8)

## 2023-06-13 LAB — LIPID PANEL
Chol/HDL Ratio: 4.7 {ratio} (ref 0.0–5.0)
Cholesterol, Total: 308 mg/dL — ABNORMAL HIGH (ref 100–199)
HDL: 65 mg/dL (ref >39)
LDL Chol Calc (NIH): 212 mg/dL — ABNORMAL HIGH (ref 0–99)
Triglycerides: 165 mg/dL — ABNORMAL HIGH (ref 0–149)
VLDL Cholesterol Cal: 31 mg/dL (ref 5–40)

## 2023-06-13 LAB — URIC ACID: Uric Acid: 8 mg/dL (ref 3.8–8.4)

## 2023-06-13 LAB — LEAD, BLOOD (ADULT >= 16 YRS): Lead-Whole Blood: 5.4 ug/dL — ABNORMAL HIGH (ref 0.0–3.4)

## 2023-06-13 NOTE — Progress Notes (Signed)
Still pending lead level.  Please call patient about the results and recommendations in his decision on my questions

## 2023-06-16 ENCOUNTER — Other Ambulatory Visit: Payer: Self-pay | Admitting: Medical

## 2023-06-16 MED ORDER — ALLOPURINOL 100 MG PO TABS: ORAL_TABLET | ORAL | 1 refills | Status: DC

## 2023-06-16 MED ORDER — PITAVASTATIN CALCIUM 4 MG PO TABS: 1.0000 | ORAL_TABLET | Freq: Every day | ORAL | 2 refills | Status: AC

## 2023-06-19 ENCOUNTER — Other Ambulatory Visit: Payer: Self-pay | Admitting: Medical

## 2023-06-19 MED ORDER — PITAVASTATIN CALCIUM 4 MG PO TABS: 1.0000 | ORAL_TABLET | Freq: Every day | ORAL | 1 refills | Status: AC

## 2023-10-30 ENCOUNTER — Telehealth: Payer: Self-pay

## 2023-10-30 ENCOUNTER — Other Ambulatory Visit: Payer: Self-pay | Admitting: Medical

## 2023-10-30 DIAGNOSIS — F32A Depression, unspecified: Secondary | ICD-10-CM

## 2023-10-30 MED ORDER — ESCITALOPRAM OXALATE 20 MG PO TABS
20.0000 mg | ORAL_TABLET | Freq: Every day | ORAL | 1 refills | Status: DC
Start: 2023-10-30 — End: 2024-04-18

## 2023-10-30 NOTE — Telephone Encounter (Signed)
 Pt is prescribed 200 mg allopurinol  daily, I called Pt to ask and left voicemail to call back.

## 2024-04-17 ENCOUNTER — Other Ambulatory Visit: Payer: Self-pay | Admitting: Medical

## 2024-04-17 DIAGNOSIS — F32A Depression, unspecified: Secondary | ICD-10-CM
